# Patient Record
Sex: Male | Born: 1935 | Race: White | Hispanic: No | Smoking: Former smoker
Health system: Southern US, Community
[De-identification: ages and names within clinical notes are randomized; demographics above are authoritative.]

## PROBLEM LIST (undated history)

## (undated) DIAGNOSIS — M545 Low back pain, unspecified: Secondary | ICD-10-CM

## (undated) DIAGNOSIS — Z8679 Personal history of other diseases of the circulatory system: Secondary | ICD-10-CM

## (undated) DIAGNOSIS — R234 Changes in skin texture: Secondary | ICD-10-CM

## (undated) DIAGNOSIS — M1990 Unspecified osteoarthritis, unspecified site: Secondary | ICD-10-CM

## (undated) DIAGNOSIS — R339 Retention of urine, unspecified: Secondary | ICD-10-CM

## (undated) DIAGNOSIS — M810 Age-related osteoporosis without current pathological fracture: Secondary | ICD-10-CM

## (undated) DIAGNOSIS — H9190 Unspecified hearing loss, unspecified ear: Secondary | ICD-10-CM

## (undated) DIAGNOSIS — I499 Cardiac arrhythmia, unspecified: Secondary | ICD-10-CM

## (undated) DIAGNOSIS — I498 Other specified cardiac arrhythmias: Secondary | ICD-10-CM

## (undated) DIAGNOSIS — N529 Male erectile dysfunction, unspecified: Secondary | ICD-10-CM

## (undated) DIAGNOSIS — I219 Acute myocardial infarction, unspecified: Secondary | ICD-10-CM

## (undated) DIAGNOSIS — K219 Gastro-esophageal reflux disease without esophagitis: Secondary | ICD-10-CM

## (undated) DIAGNOSIS — I10 Essential (primary) hypertension: Secondary | ICD-10-CM

## (undated) DIAGNOSIS — M25559 Pain in unspecified hip: Secondary | ICD-10-CM

## (undated) HISTORY — DX: Age-related osteoporosis without current pathological fracture: M81.0

## (undated) HISTORY — DX: Male erectile dysfunction, unspecified: N52.9

## (undated) HISTORY — DX: Low back pain: M54.5

## (undated) HISTORY — DX: Low back pain, unspecified: M54.50

## (undated) HISTORY — DX: Unspecified osteoarthritis, unspecified site: M19.90

## (undated) HISTORY — DX: Pain in unspecified hip: M25.559

## (undated) HISTORY — PX: EYE SURGERY: SHX253

---

## 2001-04-05 ENCOUNTER — Encounter: Admission: RE | Admit: 2001-04-05 | Discharge: 2001-04-05 | Payer: Self-pay | Admitting: Family Medicine

## 2001-04-05 ENCOUNTER — Encounter: Payer: Self-pay | Admitting: Family Medicine

## 2004-04-11 ENCOUNTER — Encounter: Admission: RE | Admit: 2004-04-11 | Discharge: 2004-04-11 | Payer: Self-pay | Admitting: Family Medicine

## 2005-07-27 ENCOUNTER — Encounter: Admission: RE | Admit: 2005-07-27 | Discharge: 2005-07-27 | Payer: Self-pay | Admitting: Family Medicine

## 2008-06-14 ENCOUNTER — Ambulatory Visit: Payer: Self-pay | Admitting: Internal Medicine

## 2008-06-28 ENCOUNTER — Encounter: Payer: Self-pay | Admitting: Internal Medicine

## 2008-06-28 ENCOUNTER — Ambulatory Visit: Payer: Self-pay | Admitting: Internal Medicine

## 2008-06-28 HISTORY — PX: OTHER SURGICAL HISTORY: SHX169

## 2008-07-03 ENCOUNTER — Encounter: Payer: Self-pay | Admitting: Internal Medicine

## 2011-05-13 ENCOUNTER — Other Ambulatory Visit: Payer: Self-pay | Admitting: Dermatology

## 2011-06-24 ENCOUNTER — Other Ambulatory Visit: Payer: Self-pay | Admitting: Dermatology

## 2014-02-12 ENCOUNTER — Other Ambulatory Visit: Payer: Self-pay | Admitting: Internal Medicine

## 2014-02-12 DIAGNOSIS — R110 Nausea: Secondary | ICD-10-CM

## 2014-02-12 DIAGNOSIS — R109 Unspecified abdominal pain: Secondary | ICD-10-CM

## 2014-02-15 ENCOUNTER — Ambulatory Visit
Admission: RE | Admit: 2014-02-15 | Discharge: 2014-02-15 | Disposition: A | Discharge status: Home / Self Care | Payer: Medicare Other | Source: Ambulatory Visit | Attending: Internal Medicine | Admitting: Internal Medicine

## 2014-02-15 DIAGNOSIS — R110 Nausea: Secondary | ICD-10-CM

## 2014-02-15 DIAGNOSIS — R109 Unspecified abdominal pain: Secondary | ICD-10-CM

## 2014-03-01 ENCOUNTER — Encounter (INDEPENDENT_AMBULATORY_CARE_PROVIDER_SITE_OTHER): Payer: Self-pay | Admitting: Pediatrics

## 2014-03-06 ENCOUNTER — Encounter (INDEPENDENT_AMBULATORY_CARE_PROVIDER_SITE_OTHER): Payer: Self-pay | Admitting: Surgery

## 2014-03-06 ENCOUNTER — Ambulatory Visit (INDEPENDENT_AMBULATORY_CARE_PROVIDER_SITE_OTHER): Payer: Medicare Other | Admitting: Surgery

## 2014-03-06 VITALS — BP 133/84 | HR 71 | Temp 98.8°F | Resp 14 | Ht 64.5 in | Wt 155.0 lb

## 2014-03-06 DIAGNOSIS — K8020 Calculus of gallbladder without cholecystitis without obstruction: Secondary | ICD-10-CM

## 2014-03-06 NOTE — Progress Notes (Signed)
Patient ID: James Calhoun, male   DOB: 1927/11/08, 80 y.o.   MRN: [id number]  Chief Complaint  Patient presents with  . Abdominal Pain    HPI James Capeslroy Keesling is a 80 y.o. male.   HPI He is referred by Dr. Eloise HarmanPaterson for evaluation of symptomatic cholelithiasis.  He has been having worsening nausea for the past month.  It started out in the morning initially but now has persisted throughout the day now.  He has vague right sided pain and chronic back pain.  There is no nausea.   Past Medical History  Diagnosis Date  . Low back pain   . Hip pain   . ED (erectile dysfunction)   . Arthritis   . Osteoporosis     History reviewed. No pertinent past surgical history.  History reviewed. No pertinent family history.  Social History History  Substance Use Topics  . Smoking status: Former Games developermoker  . Smokeless tobacco: Not on file  . Alcohol Use: No    No Known Allergies  Current Outpatient Prescriptions  Medication Sig Dispense Refill  . acetaminophen (TYLENOL) 500 MG tablet Take 500 mg by mouth every 6 (six) hours as needed.      . gabapentin (NEURONTIN) 100 MG capsule Take 100 mg by mouth 3 (three) times daily.      Marland Kitchen. losartan-hydrochlorothiazide (HYZAAR) 100-25 MG per tablet Take 1 tablet by mouth daily.      . metoprolol succinate (TOPROL-XL) 25 MG 24 hr tablet       . metoprolol tartrate (LOPRESSOR) 25 MG tablet Take 25 mg by mouth 2 (two) times daily.      . Multiple Vitamins-Minerals (PRESERVISION/LUTEIN PO) Take by mouth.      . naproxen (NAPROSYN) 500 MG tablet Take 500 mg by mouth 2 (two) times daily with a meal.      . pantoprazole (PROTONIX) 40 MG tablet Take 40 mg by mouth daily.      . predniSONE (DELTASONE) 20 MG tablet Take 20 mg by mouth daily with breakfast.      . Probiotic Product (ALIGN) 4 MG CAPS Take by mouth.      . senna-docusate (SENOKOT-S) 8.6-50 MG per tablet Take 1 tablet by mouth daily.      . traMADol (ULTRAM) 50 MG tablet Take by mouth every 6 (six) hours as  needed.      . verapamil (COVERA HS) 240 MG (CO) 24 hr tablet Take 240 mg by mouth at bedtime.       No current facility-administered medications for this visit.    Review of Systems Review of Systems  Constitutional: Negative for fever, chills and unexpected weight change.  HENT: Positive for hearing loss. Negative for congestion, sore throat, trouble swallowing and voice change.   Eyes: Negative for visual disturbance.  Respiratory: Negative for cough and wheezing.   Cardiovascular: Negative for chest pain, palpitations and leg swelling.  Gastrointestinal: Positive for nausea. Negative for vomiting, abdominal pain, diarrhea, constipation, blood in stool, abdominal distention, anal bleeding and rectal pain.  Genitourinary: Negative for hematuria and difficulty urinating.  Musculoskeletal: Positive for arthralgias, back pain, gait problem, joint swelling and neck pain.  Skin: Negative for rash and wound.  Neurological: Negative for seizures, syncope, weakness and headaches.  Hematological: Negative for adenopathy. Does not bruise/bleed easily.  Psychiatric/Behavioral: Negative for confusion.    Blood pressure 133/84, pulse 71, temperature 98.8 F (37.1 C), temperature source Temporal, resp. rate 14, height 5' 4.5" (1.638 m), weight 155 lb (70.308  kg).  Physical Exam Physical Exam  Constitutional: He is oriented to person, place, and time. He appears well-developed and well-nourished. No distress.  Elderly gentleman in NAD  HENT:  Head: Normocephalic and atraumatic.  Right Ear: External ear normal.  Left Ear: External ear normal.  Nose: Nose normal.  Eyes: Conjunctivae are normal. Pupils are equal, round, and reactive to light. No scleral icterus.  Neck: Normal range of motion. Neck supple. No tracheal deviation present.  Cardiovascular: Normal rate, regular rhythm, normal heart sounds and intact distal pulses.   No murmur heard. Pulmonary/Chest: Effort normal and breath sounds  normal. No respiratory distress. He has no wheezes. He has no rales.  Abdominal: Soft. Bowel sounds are normal. He exhibits no distension. There is no tenderness. There is no rebound.  Musculoskeletal: Normal range of motion. He exhibits no edema and no tenderness.  Lymphadenopathy:    He has no cervical adenopathy.  Neurological: He is alert and oriented to person, place, and time.  Skin: Skin is warm and dry. No rash noted. He is not diaphoretic. No erythema.  Psychiatric: His behavior is normal. Judgment normal.    Data Reviewed Ultrasound shows cholelithiasis with normal CBD.  LFT's are normal  Assessment    Symptomatic cholelithiasis     Plan    I discussed this with the patient and his wife.  They want to proceed with lap chole. I discussed the procedure in detail.  The patient was given Agricultural engineereducational material.  We discussed the risks and benefits of a laparoscopic cholecystectomy and possible cholangiogram including, but not limited to bleeding, infection, injury to surrounding structures such as the intestine or liver, bile leak, retained gallstones, need to convert to an open procedure, prolonged diarrhea, blood clots such as  DVT, common bile duct injury, anesthesia risks, and possible need for additional procedures.  They also understand that this may not resolve his symptoms at all.. We discussed the typical post-operative recovery course.        Shelly RubensteinDouglas A Mickel Schreur 03/06/2014, 4:12 PM

## 2014-03-07 ENCOUNTER — Other Ambulatory Visit (HOSPITAL_COMMUNITY): Payer: Self-pay | Admitting: Anesthesiology

## 2014-03-07 ENCOUNTER — Encounter (HOSPITAL_COMMUNITY): Payer: Self-pay | Admitting: Pharmacy Technician

## 2014-03-07 ENCOUNTER — Encounter (HOSPITAL_COMMUNITY): Payer: Self-pay

## 2014-03-07 NOTE — Patient Instructions (Addendum)
20 Maurene Capeslroy Dorin  03/07/2014   Your procedure is scheduled on: Wednesday May 27th, 2015  [facility] and follow signs to  Short Stay Center at  630 AM.  Call this number if you have problems the morning of surgery [phone]   Remember:  Do not eat food or drink liquids :After Midnight.     Take these medicines the morning of surgery with A SIP OF WATER: gabapentin (neurontin), metorprolol succinate (toprol), pantaprazole (protonix)                               You may not have any metal on your body including hair pins and piercings  Do not wear jewelry, make-up, lotions, powders, or deodorant.   Men may shave face and neck.  Do not bring valuables to the hospital. [facility] IS NOT RESPONSIBLE FOR VALUABLES.  Contacts, dentures or bridgework may not be worn into surgery.  Leave suitcase in the car. After surgery it may be brought to your room.  For patients admitted to the hospital, checkout time is 11:00 AM the day of discharge.   Patients discharged the day of surgery will not be allowed to drive home.  Name and phone number of your driver:  Special Instructions: N/A  ________________________________________________________________________  [facility] - Preparing for Surgery Before surgery, you can play an important role.  Because skin is not sterile, your skin needs to be as free of germs as possible.  You can reduce the number of germs on your skin by washing with CHG (chlorahexidine gluconate) soap before surgery.  CHG is an antiseptic cleaner which kills germs and bonds with the skin to continue killing germs even after washing. Please DO NOT use if you have an allergy to CHG or antibacterial soaps.  If your skin becomes reddened/irritated stop using the CHG and inform your nurse when you arrive at Short Stay. Do not shave (including legs and underarms) for at least 48 hours prior to the first CHG shower.  You may shave your face/neck. Please  follow these instructions carefully:  1.  Shower with CHG Soap the night before surgery and the  morning of Surgery.  2.  If you choose to wash your hair, wash your hair first as usual with your  normal  shampoo.  3.  After you shampoo, rinse your hair and body thoroughly to remove the  shampoo.                           4.  Use CHG as you would any other liquid soap.  You can apply chg directly  to the skin and wash                       Gently with a scrungie or clean washcloth.  5.  Apply the CHG Soap to your body ONLY FROM THE NECK DOWN.   Do not use on face/ open                           Wound or open sores. Avoid contact with eyes, ears mouth and genitals (private parts).                       Wash face,  Genitals (private parts) with your normal soap.  6.  Wash thoroughly, paying special attention to the area where your surgery  will be performed.  7.  Thoroughly rinse your body with warm water from the neck down.  8.  DO NOT shower/wash with your normal soap after using and rinsing off  the CHG Soap.                9.  Pat yourself dry with a clean towel.            10.  Wear clean pajamas.            11.  Place clean sheets on your bed the night of your first shower and do not  sleep with pets. Day of Surgery : Do not apply any lotions/deodorants the morning of surgery.  Please wear clean clothes to the hospital/surgery center.  FAILURE TO FOLLOW THESE INSTRUCTIONS MAY RESULT IN THE CANCELLATION OF YOUR SURGERY PATIENT SIGNATURE_________________________________  NURSE SIGNATURE__________________________________  ________________________________________________________________________

## 2014-03-08 ENCOUNTER — Encounter (HOSPITAL_COMMUNITY): Payer: Self-pay

## 2014-03-08 ENCOUNTER — Encounter (INDEPENDENT_AMBULATORY_CARE_PROVIDER_SITE_OTHER): Payer: Self-pay | Admitting: General Surgery

## 2014-03-08 ENCOUNTER — Encounter (HOSPITAL_COMMUNITY)
Admission: RE | Admit: 2014-03-08 | Discharge: 2014-03-08 | Disposition: A | Discharge status: Home / Self Care | Payer: Medicare Other | Source: Ambulatory Visit | Attending: Surgery | Admitting: Surgery

## 2014-03-08 ENCOUNTER — Telehealth (INDEPENDENT_AMBULATORY_CARE_PROVIDER_SITE_OTHER): Payer: Self-pay

## 2014-03-08 ENCOUNTER — Ambulatory Visit (HOSPITAL_COMMUNITY)
Admission: RE | Admit: 2014-03-08 | Discharge: 2014-03-08 | Disposition: A | Discharge status: Home / Self Care | Payer: Medicare Other | Source: Ambulatory Visit | Attending: Anesthesiology | Admitting: Anesthesiology

## 2014-03-08 ENCOUNTER — Other Ambulatory Visit (INDEPENDENT_AMBULATORY_CARE_PROVIDER_SITE_OTHER): Payer: Self-pay | Admitting: Surgery

## 2014-03-08 DIAGNOSIS — J9819 Other pulmonary collapse: Secondary | ICD-10-CM | POA: Insufficient documentation

## 2014-03-08 DIAGNOSIS — K829 Disease of gallbladder, unspecified: Secondary | ICD-10-CM

## 2014-03-08 DIAGNOSIS — Z01812 Encounter for preprocedural laboratory examination: Secondary | ICD-10-CM | POA: Insufficient documentation

## 2014-03-08 DIAGNOSIS — M47814 Spondylosis without myelopathy or radiculopathy, thoracic region: Secondary | ICD-10-CM | POA: Insufficient documentation

## 2014-03-08 DIAGNOSIS — I517 Cardiomegaly: Secondary | ICD-10-CM | POA: Insufficient documentation

## 2014-03-08 DIAGNOSIS — I10 Essential (primary) hypertension: Secondary | ICD-10-CM | POA: Insufficient documentation

## 2014-03-08 DIAGNOSIS — Z01818 Encounter for other preprocedural examination: Secondary | ICD-10-CM | POA: Insufficient documentation

## 2014-03-08 DIAGNOSIS — Z01810 Encounter for preprocedural cardiovascular examination: Secondary | ICD-10-CM | POA: Insufficient documentation

## 2014-03-08 HISTORY — DX: Gastro-esophageal reflux disease without esophagitis: K21.9

## 2014-03-08 HISTORY — DX: Essential (primary) hypertension: I10

## 2014-03-08 HISTORY — DX: Changes in skin texture: R23.4

## 2014-03-08 LAB — BASIC METABOLIC PANEL
BUN: 20 mg/dL (ref 6–23)
CALCIUM: 9.8 mg/dL (ref 8.4–10.5)
CO2: 25 meq/L (ref 19–32)
CREATININE: 1.15 mg/dL (ref 0.50–1.35)
Chloride: 107 mEq/L (ref 96–112)
GFR calc Af Amer: 66 mL/min — ABNORMAL LOW (ref >90)
GFR, EST NON AFRICAN AMERICAN: 57 mL/min — AB (ref >90)
GLUCOSE: 100 mg/dL — AB (ref 70–99)
Potassium: 6.2 mEq/L — ABNORMAL HIGH (ref 3.7–5.3)
Sodium: 144 mEq/L (ref 137–147)

## 2014-03-08 LAB — CBC
HCT: 42.3 % (ref 39.0–52.0)
Hemoglobin: 14.4 g/dL (ref 13.0–17.0)
MCH: 32.4 pg (ref 26.0–34.0)
MCHC: 34 g/dL (ref 30.0–36.0)
MCV: 95.3 fL (ref 78.0–100.0)
PLATELETS: 172 10*3/uL (ref 150–400)
RBC: 4.44 MIL/uL (ref 4.22–5.81)
RDW: 13.3 % (ref 11.5–15.5)
WBC: 6.4 10*3/uL (ref 4.0–10.5)

## 2014-03-08 NOTE — Telephone Encounter (Signed)
Pt is scheduled for lap chole on 03/14/14 by Dr. Magnus IvanBlackman.  Pre op EKG shows bigeminy and anesthesia requires cardiac clearance.

## 2014-03-08 NOTE — Progress Notes (Signed)
bmet results routed to dr Riley Lamdouglas blackman inbasket by epic

## 2014-03-08 NOTE — Progress Notes (Signed)
Spoke with dr rose and made aware ekg results, pt needs cardiac clearance per dr rose.

## 2014-03-08 NOTE — Progress Notes (Signed)
Spoke with James Calhoun to make dr Riley Lamdouglas blackman aware had abnormal ekg at pre op 03-08-14 and needs cardiac clearance per dr rose.

## 2014-03-08 NOTE — Telephone Encounter (Signed)
Called patient wife and told her that we need to get cardiac clearance per anesthesia requires, she wanted me to call [facility] to [facility] and they had their first apt was 04-16-14, so I called [facility] and they did not have anything until 03-20-14, but per Gavin Poundeborah that Dr Magnus IvanBlackman can call tomorrow and talk to the DOD of the day to see if he can come in tomorrow and that will be Dr. Shirlee LatchMcLean. I told Mrs. Darcella GasmanDodson that we will call her tomorrow to let her know what we find out, and if we will get him an apt tomorrow or his maybe canceled. Patient wife understood

## 2014-03-09 ENCOUNTER — Encounter (INDEPENDENT_AMBULATORY_CARE_PROVIDER_SITE_OTHER): Payer: Self-pay | Admitting: General Surgery

## 2014-03-09 ENCOUNTER — Telehealth (INDEPENDENT_AMBULATORY_CARE_PROVIDER_SITE_OTHER): Payer: Self-pay | Admitting: General Surgery

## 2014-03-09 NOTE — Telephone Encounter (Signed)
Called patient wife to let her know that we have canceled his surgery on 03-14-14. The is schedule for to see Dr Armanda Magic on 03-19-14 @ 10:30 for 10:45. The wife understood

## 2014-03-14 ENCOUNTER — Ambulatory Visit (HOSPITAL_COMMUNITY): Admission: RE | Admit: 2014-03-14 | Payer: Medicare Other | Source: Ambulatory Visit | Admitting: Surgery

## 2014-03-14 ENCOUNTER — Encounter (HOSPITAL_COMMUNITY): Admission: RE | Payer: Self-pay | Source: Ambulatory Visit

## 2014-03-14 SURGERY — LAPAROSCOPIC CHOLECYSTECTOMY
Anesthesia: General

## 2014-03-19 ENCOUNTER — Encounter: Payer: Self-pay | Admitting: Internal Medicine

## 2014-03-19 ENCOUNTER — Telehealth (INDEPENDENT_AMBULATORY_CARE_PROVIDER_SITE_OTHER): Payer: Self-pay

## 2014-03-19 ENCOUNTER — Ambulatory Visit (INDEPENDENT_AMBULATORY_CARE_PROVIDER_SITE_OTHER): Payer: Medicare Other | Admitting: Internal Medicine

## 2014-03-19 ENCOUNTER — Institutional Professional Consult (permissible substitution): Payer: Medicare Other | Admitting: Cardiology

## 2014-03-19 VITALS — BP 120/62 | HR 57 | Ht 66.0 in | Wt 160.1 lb

## 2014-03-19 DIAGNOSIS — I493 Ventricular premature depolarization: Secondary | ICD-10-CM

## 2014-03-19 DIAGNOSIS — I4949 Other premature depolarization: Secondary | ICD-10-CM

## 2014-03-19 DIAGNOSIS — I499 Cardiac arrhythmia, unspecified: Secondary | ICD-10-CM

## 2014-03-19 DIAGNOSIS — I10 Essential (primary) hypertension: Secondary | ICD-10-CM

## 2014-03-19 DIAGNOSIS — R0602 Shortness of breath: Secondary | ICD-10-CM

## 2014-03-19 DIAGNOSIS — Z01810 Encounter for preprocedural cardiovascular examination: Secondary | ICD-10-CM

## 2014-03-19 DIAGNOSIS — I498 Other specified cardiac arrhythmias: Secondary | ICD-10-CM

## 2014-03-19 NOTE — Telephone Encounter (Signed)
Jenna from Dr [facility] office saw pt today for cardiac clearance for his surgery. Dr Rennis Golden has ordered a stress test for this pt on 03/30/14 before he will give his cardiac clearance. Informed Eileen Stanford that I would let Dr Magnus Ivan and Pattricia Boss know of this.

## 2014-03-19 NOTE — Patient Instructions (Addendum)
Happy [birth year] Birthday!  Your physician has requested that you have a lexiscan myoview. For further information please visit [email or web address] Please follow instruction sheet, as given.  Your physician recommends that you schedule a follow-up appointment in: 1 month.

## 2014-03-21 ENCOUNTER — Other Ambulatory Visit (INDEPENDENT_AMBULATORY_CARE_PROVIDER_SITE_OTHER): Payer: Self-pay | Admitting: Surgery

## 2014-03-21 ENCOUNTER — Encounter: Payer: Self-pay | Admitting: Internal Medicine

## 2014-03-21 DIAGNOSIS — I499 Cardiac arrhythmia, unspecified: Secondary | ICD-10-CM

## 2014-03-21 DIAGNOSIS — Z01810 Encounter for preprocedural cardiovascular examination: Secondary | ICD-10-CM | POA: Insufficient documentation

## 2014-03-21 DIAGNOSIS — I10 Essential (primary) hypertension: Secondary | ICD-10-CM | POA: Insufficient documentation

## 2014-03-21 DIAGNOSIS — I498 Other specified cardiac arrhythmias: Secondary | ICD-10-CM | POA: Insufficient documentation

## 2014-03-21 NOTE — Progress Notes (Signed)
OFFICE NOTE  Chief Complaint:  Pre-operative cardiac exam  Primary Care Physician: Garlan Fillers, MD  HPI:  James Calhoun is an 80 year old male has been having some recent abdominal pain and was diagnosed with cholelithiasis. He was scheduled to have elective cholecystectomy and a preoperative EKG demonstrated bigeminal PVCs. He is unaware of these PVCs. In fact he does not report any chest pain or shortness of breath with exertion. He denies any new fatigue or change in his exercise tolerance. His past medical history significant for hypertension, but no known coronary artery disease, no diabetes and no evidence for dyslipidemia.  An EKG in the office today again shows bigeminal PVCs with a heart rate of 57 and a possible left atrial enlargement.  PMHx:  Past Medical History  Diagnosis Date  . Low back pain   . Hip pain   . ED (erectile dysfunction)   . Arthritis   . Osteoporosis   . Hypertension   . GERD (gastroesophageal reflux disease)   . Thin skin     bruises easy    Past Surgical History  Procedure Laterality Date  . Colonscopy  06-28-2008  . Eye surgery Bilateral yrs ago    cataract lens replacments    FAMHx:  Family History  Problem Relation Age of Onset  . Cancer Mother   . Heart disease Father   . Heart disease Brother     SOCHx:   reports that he quit smoking about 45 years ago. His smoking use included Cigarettes. He has a 7.5 pack-year smoking history. He has never used smokeless tobacco. He reports that he does not drink alcohol or use illicit drugs.  ALLERGIES:  No Known Allergies  ROS: A comprehensive review of systems was negative except for: Gastrointestinal: positive for abdominal pain  HOME MEDS: Current Outpatient Prescriptions  Medication Sig Dispense Refill  . acetaminophen (TYLENOL) 500 MG tablet Take 500 mg by mouth every 6 (six) hours as needed for mild pain.       Marland Kitchen losartan-hydrochlorothiazide (HYZAAR) 100-25 MG per tablet  Take 1 tablet by mouth every morning.       . metoprolol succinate (TOPROL-XL) 25 MG 24 hr tablet Take 25 mg by mouth every morning.       . Multiple Vitamins-Minerals (PRESERVISION/LUTEIN PO) Take 1 tablet by mouth daily.       . naproxen (NAPROSYN) 500 MG tablet Take 500 mg by mouth 2 (two) times daily with a meal.      . Probiotic Product (ALIGN) 4 MG CAPS Take 4 mg by mouth daily.       Marland Kitchen senna-docusate (SENOKOT-S) 8.6-50 MG per tablet Take 1 tablet by mouth daily.      . traMADol (ULTRAM) 50 MG tablet Take 50 mg by mouth every 6 (six) hours as needed for moderate pain.       . verapamil (COVERA HS) 240 MG (CO) 24 hr tablet Take 240 mg by mouth at bedtime.       No current facility-administered medications for this visit.    LABS/IMAGING: No results found for this or any previous visit (from the past 48 hour(s)). No results found.  VITALS: BP 120/62  Pulse 57  Ht 5\' 6"  (1.676 m)  Wt 160 lb 1.6 oz (72.621 kg)  BMI 25.85 kg/m2  EXAM: General appearance: alert and no distress Neck: no carotid bruit, no JVD and thyroid not enlarged, symmetric, no tenderness/mass/nodules Lungs: clear to auscultation bilaterally Heart: regular rate and rhythm, S1,  S2 normal, no murmur, click, rub or gallop Abdomen: soft, non-tender; bowel sounds normal; no masses,  no organomegaly Extremities: extremities normal, atraumatic, no cyanosis or edema Pulses: 2+ and symmetric Skin: Skin color, texture, turgor normal. No rashes or lesions Neurologic: Grossly normal PSych: Mood, affect normal  EKG: Sinus bradycardia at 57 with bigeminal PVCs  ASSESSMENT: 1. Indeterminate perioperative risk 2. New onset bigeminal PVCs  PLAN: 1.   Mr. James Calhoun has new onset bigeminal PVCs for which she is asymptomatic. He denies any chest pain or worsening shortness of breath. I would recommend an ischemia evaluation preoperatively and will order a lexiscan nuclear stress test. If this is negative, we may be able to  increase his beta blocker slightly to see if this impacts his PVCs.  Will plan followup after the results of the stress test are available.  Thank you for the kind referral.  Chrystie NoseKenneth C. Armando Lauman, MD, [facility] Attending Cardiologist [facility]  Chrystie NoseKenneth C. Shaquoia Miers 03/21/2014, 10:04 AM

## 2014-03-28 ENCOUNTER — Telehealth (HOSPITAL_COMMUNITY): Payer: Self-pay

## 2014-03-30 ENCOUNTER — Ambulatory Visit (HOSPITAL_COMMUNITY)
Admission: RE | Admit: 2014-03-30 | Discharge: 2014-03-30 | Disposition: A | Discharge status: Home / Self Care | Payer: Medicare Other | Source: Ambulatory Visit | Attending: Cardiology | Admitting: Cardiology

## 2014-03-30 ENCOUNTER — Encounter (INDEPENDENT_AMBULATORY_CARE_PROVIDER_SITE_OTHER): Payer: Medicare Other | Admitting: Surgery

## 2014-03-30 DIAGNOSIS — R9431 Abnormal electrocardiogram [ECG] [EKG]: Secondary | ICD-10-CM | POA: Insufficient documentation

## 2014-03-30 DIAGNOSIS — I4949 Other premature depolarization: Secondary | ICD-10-CM

## 2014-03-30 DIAGNOSIS — Z01810 Encounter for preprocedural cardiovascular examination: Secondary | ICD-10-CM

## 2014-03-30 DIAGNOSIS — I10 Essential (primary) hypertension: Secondary | ICD-10-CM | POA: Insufficient documentation

## 2014-03-30 DIAGNOSIS — R0602 Shortness of breath: Secondary | ICD-10-CM | POA: Insufficient documentation

## 2014-03-30 DIAGNOSIS — Z87891 Personal history of nicotine dependence: Secondary | ICD-10-CM | POA: Insufficient documentation

## 2014-03-30 DIAGNOSIS — I493 Ventricular premature depolarization: Secondary | ICD-10-CM

## 2014-03-30 MED ORDER — REGADENOSON 0.4 MG/5ML IV SOLN
0.4000 mg | Freq: Once | INTRAVENOUS | Status: AC
  Administered 2014-03-30: 0.4 mg via INTRAVENOUS

## 2014-03-30 MED ORDER — TECHNETIUM TC 99M SESTAMIBI GENERIC - CARDIOLITE
30.5000 | Freq: Once | INTRAVENOUS | Status: AC | PRN
  Administered 2014-03-30: 31 via INTRAVENOUS

## 2014-03-30 MED ORDER — TECHNETIUM TC 99M SESTAMIBI GENERIC - CARDIOLITE
10.4000 | Freq: Once | INTRAVENOUS | Status: AC | PRN
  Administered 2014-03-30: 10 via INTRAVENOUS

## 2014-03-30 MED ORDER — AMINOPHYLLINE 25 MG/ML IV SOLN
125.0000 mg | Freq: Once | INTRAVENOUS | Status: AC
  Administered 2014-03-30: 125 mg via INTRAVENOUS

## 2014-03-30 NOTE — Procedures (Addendum)
[facility] [address] [place] [address] [phone]  Cardiology Nuclear Med Study  James Calhoun is a 80 y.o. male     MRN : [id number]     DOB: 08-31-1928  Procedure Date: 03/30/2014  Nuclear Med Background Indication for Stress Test:  Surgical Clearance and Abnormal EKG History:  No prior respiratory or cardiac history reported;No prior NUC MPI for comparison. Cardiac Risk Factors: Family History - CAD, History of Smoking and Hypertension  Symptoms:  SOB   Nuclear Pre-Procedure Caffeine/Decaff Intake:  7:00pm NPO After: 5:00am   IV Site: R Forearm  IV 0.9% NS with Angio Cath:  22g  Chest Size (in):  44"  IV Started by: Berdie OgrenAmanda Wease, RN  Height: 5\' 6"  (1.676 m)  Cup Size: n/a  BMI:  Body mass index is 25.84 kg/(m^2). Weight:  160 lb (72.576 kg)   Tech Comments:  n/a    Nuclear Med Study 1 or 2 day study: 1 day  Stress Test Type:  Lexiscan  Order Authorizing Provider:  Zoila ShutterKenneth Hilty, MD   Resting Radionuclide: Technetium 8996m Sestamibi  Resting Radionuclide Dose: 10.4 mCi   Stress Radionuclide:  Technetium 1596m Sestamibi  Stress Radionuclide Dose: 30.5 mCi           Stress Protocol Rest HR: 61 Stress HR:87  Rest BP: 170/99 Stress BP: 170/99  Exercise Time (min): n/a METS: n/a          Dose of Adenosine (mg):  n/a Dose of Lexiscan: 0.4 mg  Dose of Atropine (mg): n/a Dose of Dobutamine: n/a mcg/kg/min (at max HR)  Stress Test Technologist: Ernestene MentionGwen Farrington, CCT Nuclear Technologist: Gonzella LexPam Phillips, CNMT   Rest Procedure:  Myocardial perfusion imaging was performed at rest 45 minutes following the intravenous administration of Technetium 3396m Sestamibi. Stress Procedure:  The patient received IV Lexiscan 0.4 mg over 15-seconds.  Technetium 4096m Sestamibi injected Iv at 30-seconds.  Patient experienced shortness of breath and was administered 125 mg of Aminophylline IV at 5 minutes. There were no significant  changes with Lexiscan.  Quantitative spect images were obtained after a 45 minute delay.  Transient Ischemic Dilatation (Normal <1.22):  1.05   QGS EDV:  n/a ml QGS ESV:  n/a ml LV Ejection Fraction: Study not gated       Rest ECG: NSR with frequent PVCs, old anteroseptal infarction  Stress ECG: No significant change from baseline ECG  QPS Raw Data Images:  Normal; no motion artifact; normal heart/lung ratio. Stress Images:  severe apical perfusion defect, extending in to the mid-apical anterior septum, sparing the anterolateral wall. Moderate to severe inferior defect Rest Images:  Comparison with the stress images reveals no significant change. Subtraction (SDS):  There is a fixed defect that is most consistent with a previous infarction. LV Wall Motion:  non gated  Impression Exercise Capacity:  Lexiscan with no exercise. BP Response:  Normal blood pressure response. Clinical Symptoms:  No significant symptoms noted. ECG Impression:  No significant ECG changes with Lexiscan. Comparison with Prior Nuclear Study: No previous nuclear study performed   Overall Impression:  Intermediate risk stress nuclear study with a large inferior scar and a large apical scar. There is no significant reversible ischemia. The study was not gated due to ectopy.James Calhoun.   James Gathright, MD  03/30/2014 1:25 PM

## 2014-04-02 ENCOUNTER — Other Ambulatory Visit: Payer: Self-pay | Admitting: *Deleted

## 2014-04-02 ENCOUNTER — Telehealth (INDEPENDENT_AMBULATORY_CARE_PROVIDER_SITE_OTHER): Payer: Self-pay

## 2014-04-02 ENCOUNTER — Telehealth: Payer: Self-pay | Admitting: Internal Medicine

## 2014-04-02 ENCOUNTER — Telehealth: Payer: Self-pay | Admitting: *Deleted

## 2014-04-02 ENCOUNTER — Encounter: Payer: Self-pay | Admitting: *Deleted

## 2014-04-02 DIAGNOSIS — I493 Ventricular premature depolarization: Secondary | ICD-10-CM

## 2014-04-02 DIAGNOSIS — Z01818 Encounter for other preprocedural examination: Secondary | ICD-10-CM

## 2014-04-02 DIAGNOSIS — I499 Cardiac arrhythmia, unspecified: Principal | ICD-10-CM

## 2014-04-02 DIAGNOSIS — R9439 Abnormal result of other cardiovascular function study: Secondary | ICD-10-CM

## 2014-04-02 DIAGNOSIS — I498 Other specified cardiac arrhythmias: Secondary | ICD-10-CM

## 2014-04-02 DIAGNOSIS — Z01810 Encounter for preprocedural cardiovascular examination: Secondary | ICD-10-CM

## 2014-04-02 NOTE — Telephone Encounter (Signed)
Patient's wife notified of stress test results and need for echo. Echo ordered. They would prefer AM appmts.   Dr. Rennis GoldenHilty wants to see patient for follow up before clearance for surgery   Message sent to schedulers to set up appmts.

## 2014-04-02 NOTE — Telephone Encounter (Signed)
[facility] scheduler informed that no echo appmts are available until after patient's OV with Dr. Rennis GoldenHilty on 6/22  Will re-order echo for [facility] office and move patient appointment

## 2014-04-02 NOTE — Telephone Encounter (Signed)
Message copied by Lindell SparELKINS, Janari Gagner M on Mon Apr 02, 2014  8:08 AM ------      Message from: Chrystie NoseHILTY, KENNETH C      Created: Sun Apr 01, 2014 10:25 AM      Regarding: FW:       Nuclear stress test was abnormal. Suggests prior heart attack. Would recommend 2d Echo to look for wall motion and EF prior to coming back to see me. May ultimately need a cath.            Dr. Rennis GoldenHilty      ----- Message -----         From: Thurmon FairMihai Croitoru, MD         Sent: 03/30/2014   1:29 PM           To: Chrystie NoseKenneth C. Hilty, MD       ------

## 2014-04-02 NOTE — Telephone Encounter (Signed)
Message copied by Lindell SparELKINS, Haru Anspaugh M on Mon Apr 02, 2014  2:34 PM ------      Message from: Cleon GustinBROWN-HOLLIMAN, EBONY D      Created: Mon Apr 02, 2014  2:26 PM      Regarding: RE: needs echo       I dont schedule for [address], however if you change the order and set the location for the test for [address] it will go on their work queue.      ----- Message -----         From: Lindell SparJenna M Josiah Wojtaszek, RN         Sent: 04/02/2014   2:21 PM           To: Cleon GustinEbony Brown-Holliman      Subject: RE: needs echo                                           Can you schedule at [address]? He has an office visit with Dr. Rennis GoldenHilty on 6/22 and it needs to be done before then.. May need possible cath that's why.. And its a semi-urgent pre-op issue.Marland Kitchen.       ----- Message -----         From: Cleon GustinEbony Brown-Holliman         Sent: 04/02/2014   2:20 PM           To: Lindell SparJenna M Kinzey Sheriff, RN      Subject: RE: needs echo                                           I dont have any echo appts this week at all. The first avail is not until next Tuesday/Wednesday      ----- Message -----         From: Lindell SparJenna M Rhapsody Wolven, RN         Sent: 04/02/2014   8:11 AM           To: Cleon GustinEbony Brown-Holliman      Subject: needs echo                                               Patient needs echo this week! Thanks a ton! They prefer AM appmts                   ------

## 2014-04-02 NOTE — Telephone Encounter (Signed)
Pt is awaiting cardiac clearance from Dr Rennis GoldenHilty. Pt has had a stress test and he will have a echo this week. Pt has appt with Dr Rennis GoldenHilty on 04/09/14 before he will clear him for surgery. Informed Eileen StanfordJenna that I would make  Pattricia Bossnnie and Dr Magnus IvanBlackman aware.

## 2014-04-05 ENCOUNTER — Encounter (INDEPENDENT_AMBULATORY_CARE_PROVIDER_SITE_OTHER): Payer: Self-pay

## 2014-04-09 ENCOUNTER — Ambulatory Visit: Payer: Medicare Other | Admitting: Internal Medicine

## 2014-04-11 ENCOUNTER — Ambulatory Visit (HOSPITAL_COMMUNITY)
Admission: RE | Admit: 2014-04-11 | Discharge: 2014-04-11 | Disposition: A | Discharge status: Home / Self Care | Payer: Medicare Other | Source: Ambulatory Visit | Attending: Cardiology | Admitting: Cardiology

## 2014-04-11 DIAGNOSIS — I493 Ventricular premature depolarization: Secondary | ICD-10-CM

## 2014-04-11 DIAGNOSIS — I498 Other specified cardiac arrhythmias: Secondary | ICD-10-CM

## 2014-04-11 DIAGNOSIS — Z01818 Encounter for other preprocedural examination: Secondary | ICD-10-CM

## 2014-04-11 DIAGNOSIS — I517 Cardiomegaly: Secondary | ICD-10-CM

## 2014-04-11 DIAGNOSIS — Z01810 Encounter for preprocedural cardiovascular examination: Secondary | ICD-10-CM

## 2014-04-11 DIAGNOSIS — I499 Cardiac arrhythmia, unspecified: Secondary | ICD-10-CM

## 2014-04-11 DIAGNOSIS — R9439 Abnormal result of other cardiovascular function study: Secondary | ICD-10-CM | POA: Insufficient documentation

## 2014-04-11 MED ORDER — PERFLUTREN LIPID MICROSPHERE
1.0000 mL | INTRAVENOUS | Status: AC | PRN
  Administered 2014-04-11: 3 mL via INTRAVENOUS

## 2014-04-11 NOTE — Brief Op Note (Signed)
Pt had echo with definity per protocol. Pt tolerated well. Pt being observed for any adverse reactions prior to discharge. Pt B/P prior to echo= 126/62; Pt BP after echo 132/65; HR=72 bpm. Pt sitting up in chair at bedside. Pt talking with staff no distress noted.

## 2014-04-11 NOTE — Progress Notes (Addendum)
2D Echocardiogram Complete.  04/11/2014   Bethany McMahill, RDCS   Definity Contrast was administered to opacify the Left Ventricular Apex.

## 2014-04-12 ENCOUNTER — Ambulatory Visit (INDEPENDENT_AMBULATORY_CARE_PROVIDER_SITE_OTHER): Payer: Medicare Other | Admitting: Internal Medicine

## 2014-04-12 ENCOUNTER — Encounter: Payer: Self-pay | Admitting: Internal Medicine

## 2014-04-12 VITALS — BP 120/58 | HR 50 | Ht 66.0 in | Wt 157.3 lb

## 2014-04-12 DIAGNOSIS — I252 Old myocardial infarction: Secondary | ICD-10-CM

## 2014-04-12 DIAGNOSIS — I499 Cardiac arrhythmia, unspecified: Principal | ICD-10-CM

## 2014-04-12 DIAGNOSIS — I2589 Other forms of chronic ischemic heart disease: Secondary | ICD-10-CM

## 2014-04-12 DIAGNOSIS — Z01810 Encounter for preprocedural cardiovascular examination: Secondary | ICD-10-CM

## 2014-04-12 DIAGNOSIS — I255 Ischemic cardiomyopathy: Secondary | ICD-10-CM | POA: Insufficient documentation

## 2014-04-12 DIAGNOSIS — I498 Other specified cardiac arrhythmias: Secondary | ICD-10-CM

## 2014-04-12 DIAGNOSIS — I10 Essential (primary) hypertension: Secondary | ICD-10-CM

## 2014-04-12 NOTE — Progress Notes (Signed)
OFFICE NOTE  Chief Complaint:  Pre-operative cardiac exam  Primary Care Physician: Garlan FillersPATERSON,DANIEL G, MD  HPI:  Maurene Capeslroy Delarosa is an 80 year old male has been having some recent abdominal pain and was diagnosed with cholelithiasis. He was scheduled to have elective cholecystectomy and a preoperative EKG demonstrated bigeminal PVCs. He is unaware of these PVCs. In fact he does not report any chest pain or shortness of breath with exertion. He denies any new fatigue or change in his exercise tolerance. His past medical history significant for hypertension, but no known coronary artery disease, no diabetes and no evidence for dyslipidemia.  An EKG in the office today again shows bigeminal PVCs with a heart rate of 57 and a possible left atrial enlargement.  Mr. Laural BenesJohnson returns today for followup. Again he remains asymptomatic. He stress test indicated a dense inferoapical defect suggestive of scar. The nuclear stress test was non-gated. He subsequently underwent an echocardiogram which showed infero-apical akinesis and an EF of 45-50%. 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 He is unaware of this event. There was no evidence for ischemia. He is on good medications for heart failure including metoprolol and losartan.   PMHx:  Past Medical History  Diagnosis Date  . Low back pain   . Hip pain   . ED (erectile dysfunction)   . Arthritis   . Osteoporosis   . Hypertension   . GERD (gastroesophageal reflux disease)   . Thin skin     bruises easy    Past Surgical History  Procedure Laterality Date  . Colonscopy  06-28-2008  . Eye surgery Bilateral yrs ago    cataract lens replacments    FAMHx:  Family History  Problem Relation Age of Onset  . Cancer Mother   . Heart disease Father   . Heart disease Brother     SOCHx:   reports that he quit smoking about 45 years ago. His smoking use included Cigarettes. He has a 7.5 pack-year smoking history. He has never used smokeless tobacco.  He reports that he does not drink alcohol or use illicit drugs.  ALLERGIES:  No Known Allergies  ROS: A comprehensive review of systems was negative except for: Gastrointestinal: positive for abdominal pain  HOME MEDS: Current Outpatient Prescriptions  Medication Sig Dispense Refill  . acetaminophen (TYLENOL) 500 MG tablet Take 500 mg by mouth every 6 (six) hours as needed for mild pain.       Marland Kitchen. losartan-hydrochlorothiazide (HYZAAR) 100-25 MG per tablet Take 1 tablet by mouth every morning.       . metoprolol succinate (TOPROL-XL) 25 MG 24 hr tablet Take 25 mg by mouth every morning.       . Multiple Vitamins-Minerals (PRESERVISION/LUTEIN PO) Take 1 tablet by mouth daily.       . naproxen (NAPROSYN) 500 MG tablet Take 500 mg by mouth 2 (two) times daily with a meal.      . Probiotic Product (ALIGN) 4 MG CAPS Take 4 mg by mouth daily.       Marland Kitchen. senna-docusate (SENOKOT-S) 8.6-50 MG per tablet Take 1 tablet by mouth daily.      . traMADol (ULTRAM) 50 MG tablet Take 50 mg by mouth every 6 (six) hours as needed for moderate pain.       . verapamil (COVERA HS) 240 MG (CO) 24 hr tablet Take 240 mg by mouth at bedtime.       No current facility-administered medications for this visit.  LABS/IMAGING: No results found for this or any previous visit (from the past 48 hour(s)). No results found.  VITALS: BP 120/58  Pulse 50  Ht 5\' 6"  (1.676 m)  Wt 157 lb 4.8 oz (71.351 kg)  BMI 25.40 kg/m2  EXAM: Deferred  EKG: deferred  ASSESSMENT: 1. Low to intermediate risk for surgery 2. Ischemic cardiomyopathy, EF 45-50%, inferoapical scar 3. 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   Mr. Darcella GasmanDodson does not have any evidence of reversible ischemia on his nuclear stress test. There is however an inferior scar which is fixed and is seen on echocardiogram. His EF is 45-50%. His bigeminy may be scar mediated. He is ready on beta blocker and has had no concerning symptoms such as syncope.  Since he is asymptomatic and his nuclear stress test is nonischemic, there really is not a role for cardiac catheterization at this time. I would recommend continuing his current medications and start low-dose aspirin 81 mg daily after his gallbladder surgery. I feel that he is at low to intermediate risk for surgery.  Plan to see him back in 6 months.   Chrystie NoseKenneth C. Hilty, MD, [employer] Attending Cardiologist [facility]  HILTY,Kenneth C 04/12/2014, 1:42 PM

## 2014-04-12 NOTE — Patient Instructions (Signed)
Your physician wants you to follow-up in:  6 months. You will receive a reminder letter in the mail two months in advance. If you don't receive a letter, please call our office to schedule the follow-up appointment.   

## 2014-04-13 ENCOUNTER — Encounter (HOSPITAL_COMMUNITY): Payer: Self-pay | Admitting: Pharmacy Technician

## 2014-04-16 ENCOUNTER — Encounter (HOSPITAL_COMMUNITY): Payer: Self-pay

## 2014-04-16 ENCOUNTER — Encounter (INDEPENDENT_AMBULATORY_CARE_PROVIDER_SITE_OTHER): Payer: Self-pay

## 2014-04-16 ENCOUNTER — Other Ambulatory Visit (INDEPENDENT_AMBULATORY_CARE_PROVIDER_SITE_OTHER): Payer: Self-pay | Admitting: Surgery

## 2014-04-16 ENCOUNTER — Encounter (HOSPITAL_COMMUNITY)
Admission: RE | Admit: 2014-04-16 | Discharge: 2014-04-16 | Disposition: A | Discharge status: Home / Self Care | Payer: Medicare Other | Source: Ambulatory Visit | Attending: Surgery | Admitting: Surgery

## 2014-04-16 LAB — CBC
HCT: 40.7 % (ref 39.0–52.0)
Hemoglobin: 13.9 g/dL (ref 13.0–17.0)
MCH: 32.3 pg (ref 26.0–34.0)
MCHC: 34.2 g/dL (ref 30.0–36.0)
MCV: 94.7 fL (ref 78.0–100.0)
PLATELETS: 181 10*3/uL (ref 150–400)
RBC: 4.3 MIL/uL (ref 4.22–5.81)
RDW: 13.5 % (ref 11.5–15.5)
WBC: 8.2 10*3/uL (ref 4.0–10.5)

## 2014-04-16 LAB — BASIC METABOLIC PANEL
BUN: 23 mg/dL (ref 6–23)
CHLORIDE: 106 meq/L (ref 96–112)
CO2: 24 mEq/L (ref 19–32)
Calcium: 9.6 mg/dL (ref 8.4–10.5)
Creatinine, Ser: 1.1 mg/dL (ref 0.50–1.35)
GFR calc Af Amer: 69 mL/min — ABNORMAL LOW (ref >90)
GFR calc non Af Amer: 60 mL/min — ABNORMAL LOW (ref >90)
Glucose, Bld: 80 mg/dL (ref 70–99)
POTASSIUM: 4.8 meq/L (ref 3.7–5.3)
Sodium: 142 mEq/L (ref 137–147)

## 2014-04-16 NOTE — Patient Instructions (Addendum)
22 James Calhoun  04/16/2014   Your procedure is scheduled on: Thursday 04/19/14  [facility] 09:30 AM.  Call this number if you have problems the morning of surgery [phone]: [phone]   Remember:   Do not eat food or drink liquids After Midnight.     Take these medicines the morning of surgery with A SIP OF WATER: metoprolol   Do not wear jewelry, make-up or nail polish.  Do not wear lotions, powders, or perfumes. You may wear deodorant.  Do not shave 48 hours prior to surgery. Men may shave face and neck.  Do not bring valuables to the hospital.  Contacts, dentures or bridgework may not be worn into surgery.  Leave suitcase in the car. After surgery it may be brought to your room.  For patients admitted to the hospital, checkout time is 11:00 AM the day of discharge.  James Sonsachel Loftis, RN  pre op nurse call if needed [phone]    [facility] - Preparing [facility] Before surgery, you can play an important role.  Because skin is not sterile, your skin needs to be as free of germs as possible.  You can reduce the number of germs on your skin by washing with CHG (chlorahexidine gluconate) soap before surgery.  CHG is an antiseptic cleaner which kills germs and bonds with the skin to continue killing germs even after washing. Please DO NOT use if you have an allergy to CHG or antibacterial soaps.  If your skin becomes reddened/irritated stop using the CHG and inform your nurse when you arrive at Short Stay. Do not shave (including legs and underarms) for at least 48 hours prior to the first CHG shower.  You may shave your face/neck. Please follow these instructions carefully:  1.  Shower with CHG Soap the night before surgery and the  morning of Surgery.  2.  If you choose to wash your hair, wash your hair first as usual with your  normal  shampoo.  3.  After you shampoo, rinse your hair and body thoroughly to remove the  shampoo.                            4.   Use CHG as you would any other liquid soap.  You can apply chg directly  to the skin and wash                       Gently with a scrungie or clean washcloth.  5.  Apply the CHG Soap to your body ONLY FROM THE NECK DOWN.   Do not use on face/ open                           Wound or open sores. Avoid contact with eyes, ears mouth and genitals (private parts).                       Wash face,  Genitals (private parts) with your normal soap.             6.  Wash thoroughly, paying special attention to the area where your surgery  will be performed.  7.  Thoroughly rinse your body with warm water from the neck down.  8.  DO NOT shower/wash with your normal soap after using and rinsing off  the CHG Soap.  9.  Pat yourself dry with a clean towel.            10.  Wear clean pajamas.            11.  Place clean sheets on your bed the night of your first shower and do not  sleep with pets. Day of Surgery : Do not apply any lotions/deodorants the morning of surgery.  Please wear clean clothes to the hospital/surgery center.  FAILURE TO FOLLOW THESE INSTRUCTIONS MAY RESULT IN THE CANCELLATION OF YOUR SURGERY PATIENT SIGNATURE_________________________________  NURSE SIGNATURE__________________________________  ________________________________________________________________________

## 2014-04-16 NOTE — Progress Notes (Signed)
Chest x-ray 03/08/14 on [facility], EKG 03/19/14 on [facility]

## 2014-04-18 NOTE — H&P (Signed)
Chief Complaint   Patient presents with   .  Abdominal Pain   HPI  James Calhoun is a 80 y.o. male.  HPI  He is referred by Dr. Eloise HarmanPaterson for evaluation of symptomatic cholelithiasis. He has been having worsening nausea for the past month. It started out in the morning initially but now has persisted throughout the day now. He has vague right sided pain and chronic back pain. There is no nausea.  Past Medical History   Diagnosis  Date   .  Low back pain    .  Hip pain    .  ED (erectile dysfunction)    .  Arthritis    .  Osteoporosis    History reviewed. No pertinent past surgical history.  History reviewed. No pertinent family history.  Social History  History   Substance Use Topics   .  Smoking status:  Former Games developermoker   .  Smokeless tobacco:  Not on file   .  Alcohol Use:  No   No Known Allergies  Current Outpatient Prescriptions   Medication  Sig  Dispense  Refill   .  acetaminophen (TYLENOL) 500 MG tablet  Take 500 mg by mouth every 6 (six) hours as needed.     .  gabapentin (NEURONTIN) 100 MG capsule  Take 100 mg by mouth 3 (three) times daily.     Marland Kitchen.  losartan-hydrochlorothiazide (HYZAAR) 100-25 MG per tablet  Take 1 tablet by mouth daily.     .  metoprolol succinate (TOPROL-XL) 25 MG 24 hr tablet      .  metoprolol tartrate (LOPRESSOR) 25 MG tablet  Take 25 mg by mouth 2 (two) times daily.     .  Multiple Vitamins-Minerals (PRESERVISION/LUTEIN PO)  Take by mouth.     .  naproxen (NAPROSYN) 500 MG tablet  Take 500 mg by mouth 2 (two) times daily with a meal.     .  pantoprazole (PROTONIX) 40 MG tablet  Take 40 mg by mouth daily.     .  predniSONE (DELTASONE) 20 MG tablet  Take 20 mg by mouth daily with breakfast.     .  Probiotic Product (ALIGN) 4 MG CAPS  Take by mouth.     .  senna-docusate (SENOKOT-S) 8.6-50 MG per tablet  Take 1 tablet by mouth daily.     .  traMADol (ULTRAM) 50 MG tablet  Take by mouth every 6 (six) hours as needed.     .  verapamil (COVERA HS) 240 MG  (CO) 24 hr tablet  Take 240 mg by mouth at bedtime.      No current facility-administered medications for this visit.   Review of Systems  Review of Systems  Constitutional: Negative for fever, chills and unexpected weight change.  HENT: Positive for hearing loss. Negative for congestion, sore throat, trouble swallowing and voice change.  Eyes: Negative for visual disturbance.  Respiratory: Negative for cough and wheezing.  Cardiovascular: Negative for chest pain, palpitations and leg swelling.  Gastrointestinal: Positive for nausea. Negative for vomiting, abdominal pain, diarrhea, constipation, blood in stool, abdominal distention, anal bleeding and rectal pain.  Genitourinary: Negative for hematuria and difficulty urinating.  Musculoskeletal: Positive for arthralgias, back pain, gait problem, joint swelling and neck pain.  Skin: Negative for rash and wound.  Neurological: Negative for seizures, syncope, weakness and headaches.  Hematological: Negative for adenopathy. Does not bruise/bleed easily.  Psychiatric/Behavioral: Negative for confusion.  Blood pressure 133/84, pulse 71, temperature 98.8 F (37.1  C), temperature source Temporal, resp. rate 14, height 5' 4.5" (1.638 m), weight 155 lb (70.308 kg).  Physical Exam  Physical Exam  Constitutional: He is oriented to person, place, and time. He appears well-developed and well-nourished. No distress.  Elderly gentleman in NAD  HENT:  Head: Normocephalic and atraumatic.  Right Ear: External ear normal.  Left Ear: External ear normal.  Nose: Nose normal.  Eyes: Conjunctivae are normal. Pupils are equal, round, and reactive to light. No scleral icterus.  Neck: Normal range of motion. Neck supple. No tracheal deviation present.  Cardiovascular: Normal rate, regular rhythm, normal heart sounds and intact distal pulses.  No murmur heard.  Pulmonary/Chest: Effort normal and breath sounds normal. No respiratory distress. He has no wheezes. He  has no rales.  Abdominal: Soft. Bowel sounds are normal. He exhibits no distension. There is no tenderness. There is no rebound.  Musculoskeletal: Normal range of motion. He exhibits no edema and no tenderness.  Lymphadenopathy:  He has no cervical adenopathy.  Neurological: He is alert and oriented to person, place, and time.  Skin: Skin is warm and dry. No rash noted. He is not diaphoretic. No erythema.  Psychiatric: His behavior is normal. Judgment normal.  Data Reviewed  Ultrasound shows cholelithiasis with normal CBD. LFT's are normal   Assessment  Symptomatic cholelithiasis   Plan  I discussed this with the patient and his wife. They want to proceed with lap chole.  I discussed the procedure in detail. The patient was given [employer] engineered[employer]ational material. We discussed the risks and benefits of a laparoscopic cholecystectomy and possible cholangiogram including, but not limited to bleeding, infection, injury to surrounding structures such as the intestine or liver, bile leak, retained gallstones, need to convert to an open procedure, prolonged diarrhea, blood clots such as DVT, common bile duct injury, anesthesia risks, and possible need for additional procedures. They also understand that this may not resolve his symptoms at all.. We discussed the typical post-operative recovery course.

## 2014-04-19 ENCOUNTER — Encounter (HOSPITAL_COMMUNITY): Payer: Medicare Other | Admitting: Certified Registered Nurse Anesthetist

## 2014-04-19 ENCOUNTER — Observation Stay (HOSPITAL_COMMUNITY)
Admission: RE | Admit: 2014-04-19 | Discharge: 2014-04-20 | Disposition: A | Discharge status: Home / Self Care | Payer: Medicare Other | Source: Ambulatory Visit | Attending: Surgery | Admitting: Surgery

## 2014-04-19 ENCOUNTER — Encounter (HOSPITAL_COMMUNITY): Payer: Self-pay | Admitting: *Deleted

## 2014-04-19 ENCOUNTER — Encounter (HOSPITAL_COMMUNITY)
Admission: RE | Disposition: A | Discharge status: Home / Self Care | Payer: Self-pay | Source: Ambulatory Visit | Attending: Surgery

## 2014-04-19 ENCOUNTER — Ambulatory Visit (HOSPITAL_COMMUNITY): Payer: Medicare Other | Admitting: Certified Registered Nurse Anesthetist

## 2014-04-19 DIAGNOSIS — I2510 Atherosclerotic heart disease of native coronary artery without angina pectoris: Secondary | ICD-10-CM | POA: Insufficient documentation

## 2014-04-19 DIAGNOSIS — IMO0002 Reserved for concepts with insufficient information to code with codable children: Secondary | ICD-10-CM | POA: Insufficient documentation

## 2014-04-19 DIAGNOSIS — I10 Essential (primary) hypertension: Secondary | ICD-10-CM | POA: Insufficient documentation

## 2014-04-19 DIAGNOSIS — Z87891 Personal history of nicotine dependence: Secondary | ICD-10-CM | POA: Insufficient documentation

## 2014-04-19 DIAGNOSIS — I509 Heart failure, unspecified: Secondary | ICD-10-CM | POA: Insufficient documentation

## 2014-04-19 DIAGNOSIS — Z79899 Other long term (current) drug therapy: Secondary | ICD-10-CM | POA: Insufficient documentation

## 2014-04-19 DIAGNOSIS — K219 Gastro-esophageal reflux disease without esophagitis: Secondary | ICD-10-CM | POA: Insufficient documentation

## 2014-04-19 DIAGNOSIS — K8010 Calculus of gallbladder with chronic cholecystitis without obstruction: Secondary | ICD-10-CM

## 2014-04-19 DIAGNOSIS — I252 Old myocardial infarction: Secondary | ICD-10-CM | POA: Insufficient documentation

## 2014-04-19 DIAGNOSIS — K8020 Calculus of gallbladder without cholecystitis without obstruction: Secondary | ICD-10-CM | POA: Diagnosis present

## 2014-04-19 DIAGNOSIS — M810 Age-related osteoporosis without current pathological fracture: Secondary | ICD-10-CM | POA: Insufficient documentation

## 2014-04-19 HISTORY — PX: CHOLECYSTECTOMY: SHX55

## 2014-04-19 SURGERY — LAPAROSCOPIC CHOLECYSTECTOMY
Anesthesia: General | Site: Abdomen

## 2014-04-19 MED ORDER — LOSARTAN POTASSIUM 50 MG PO TABS
100.0000 mg | ORAL_TABLET | Freq: Every day | ORAL | Status: DC
  Administered 2014-04-19: 100 mg via ORAL
  Filled 2014-04-19 (×2): qty 2

## 2014-04-19 MED ORDER — ROCURONIUM BROMIDE 100 MG/10ML IV SOLN
INTRAVENOUS | Status: DC | PRN
  Administered 2014-04-19: 20 mg via INTRAVENOUS

## 2014-04-19 MED ORDER — HYDROCODONE-ACETAMINOPHEN 5-325 MG PO TABS: 1.0000 | ORAL_TABLET | Freq: Four times a day (QID) | ORAL | Status: DC | PRN

## 2014-04-19 MED ORDER — FENTANYL CITRATE 0.05 MG/ML IJ SOLN
INTRAMUSCULAR | Status: DC | PRN
  Administered 2014-04-19: 50 ug via INTRAVENOUS
  Administered 2014-04-19: 25 ug via INTRAVENOUS

## 2014-04-19 MED ORDER — BUPIVACAINE HCL (PF) 0.5 % IJ SOLN
INTRAMUSCULAR | Status: AC
  Filled 2014-04-19: qty 30

## 2014-04-19 MED ORDER — ENOXAPARIN SODIUM 30 MG/0.3ML ~~LOC~~ SOLN
30.0000 mg | SUBCUTANEOUS | Status: DC
  Administered 2014-04-20: 30 mg via SUBCUTANEOUS
  Filled 2014-04-19 (×2): qty 0.3

## 2014-04-19 MED ORDER — NEOSTIGMINE METHYLSULFATE 10 MG/10ML IV SOLN
INTRAVENOUS | Status: DC | PRN
  Administered 2014-04-19: 4 mg via INTRAVENOUS

## 2014-04-19 MED ORDER — LOSARTAN POTASSIUM-HCTZ 100-25 MG PO TABS: 1.0000 | ORAL_TABLET | Freq: Every day | ORAL | Status: DC

## 2014-04-19 MED ORDER — GLYCOPYRROLATE 0.2 MG/ML IJ SOLN
INTRAMUSCULAR | Status: DC | PRN
  Administered 2014-04-19: 0.6 mg via INTRAVENOUS

## 2014-04-19 MED ORDER — PROMETHAZINE HCL 25 MG/ML IJ SOLN: 6.2500 mg | INTRAMUSCULAR | Status: DC | PRN

## 2014-04-19 MED ORDER — OXYCODONE HCL 5 MG/5ML PO SOLN
5.0000 mg | Freq: Once | ORAL | Status: DC | PRN
  Filled 2014-04-19: qty 5

## 2014-04-19 MED ORDER — EPHEDRINE SULFATE 50 MG/ML IJ SOLN
INTRAMUSCULAR | Status: AC
  Filled 2014-04-19: qty 1

## 2014-04-19 MED ORDER — FENTANYL CITRATE 0.05 MG/ML IJ SOLN
INTRAMUSCULAR | Status: AC
  Filled 2014-04-19: qty 5

## 2014-04-19 MED ORDER — LIDOCAINE HCL (CARDIAC) 20 MG/ML IV SOLN
INTRAVENOUS | Status: DC | PRN
  Administered 2014-04-19: 80 mg via INTRAVENOUS

## 2014-04-19 MED ORDER — PROPOFOL 10 MG/ML IV BOLUS
INTRAVENOUS | Status: DC | PRN
  Administered 2014-04-19: 120 mg via INTRAVENOUS

## 2014-04-19 MED ORDER — METOPROLOL SUCCINATE ER 25 MG PO TB24
25.0000 mg | ORAL_TABLET | Freq: Every day | ORAL | Status: DC
  Filled 2014-04-19: qty 1

## 2014-04-19 MED ORDER — SODIUM CHLORIDE 0.9 % IJ SOLN
INTRAMUSCULAR | Status: AC
  Filled 2014-04-19: qty 10

## 2014-04-19 MED ORDER — HYDROCHLOROTHIAZIDE 25 MG PO TABS
25.0000 mg | ORAL_TABLET | Freq: Every day | ORAL | Status: DC
  Administered 2014-04-19: 25 mg via ORAL
  Filled 2014-04-19 (×2): qty 1

## 2014-04-19 MED ORDER — GLYCOPYRROLATE 0.2 MG/ML IJ SOLN
INTRAMUSCULAR | Status: AC
  Filled 2014-04-19: qty 3

## 2014-04-19 MED ORDER — VERAPAMIL HCL ER 240 MG PO TBCR
240.0000 mg | EXTENDED_RELEASE_TABLET | Freq: Every day | ORAL | Status: DC
  Administered 2014-04-19: 240 mg via ORAL
  Filled 2014-04-19 (×2): qty 1

## 2014-04-19 MED ORDER — LIDOCAINE HCL (CARDIAC) 20 MG/ML IV SOLN
INTRAVENOUS | Status: AC
  Filled 2014-04-19: qty 5

## 2014-04-19 MED ORDER — POLYVINYL ALCOHOL 1.4 % OP SOLN
1.0000 [drp] | Freq: Every day | OPHTHALMIC | Status: DC | PRN
  Filled 2014-04-19: qty 15

## 2014-04-19 MED ORDER — IBUPROFEN 600 MG PO TABS
600.0000 mg | ORAL_TABLET | Freq: Four times a day (QID) | ORAL | Status: DC | PRN
  Filled 2014-04-19: qty 1

## 2014-04-19 MED ORDER — OXYCODONE HCL 5 MG PO TABS: 5.0000 mg | ORAL_TABLET | Freq: Once | ORAL | Status: DC | PRN

## 2014-04-19 MED ORDER — MORPHINE SULFATE 2 MG/ML IJ SOLN: 1.0000 mg | INTRAMUSCULAR | Status: DC | PRN

## 2014-04-19 MED ORDER — SUCCINYLCHOLINE CHLORIDE 20 MG/ML IJ SOLN
INTRAMUSCULAR | Status: DC | PRN
  Administered 2014-04-19: 100 mg via INTRAVENOUS

## 2014-04-19 MED ORDER — POTASSIUM CHLORIDE IN NACL 20-0.9 MEQ/L-% IV SOLN
INTRAVENOUS | Status: DC
  Administered 2014-04-19: 15:00:00 via INTRAVENOUS
  Filled 2014-04-19 (×2): qty 1000

## 2014-04-19 MED ORDER — BUPIVACAINE HCL (PF) 0.5 % IJ SOLN
INTRAMUSCULAR | Status: DC | PRN
  Administered 2014-04-19: 20 mL

## 2014-04-19 MED ORDER — MEPERIDINE HCL 50 MG/ML IJ SOLN
6.2500 mg | INTRAMUSCULAR | Status: DC | PRN
Start: 2014-04-19 — End: 2014-04-19

## 2014-04-19 MED ORDER — LACTATED RINGERS IV SOLN
INTRAVENOUS | Status: DC | PRN
  Administered 2014-04-19 (×2): via INTRAVENOUS

## 2014-04-19 MED ORDER — CEFAZOLIN SODIUM-DEXTROSE 2-3 GM-% IV SOLR
2.0000 g | INTRAVENOUS | Status: AC
  Administered 2014-04-19: 2 g via INTRAVENOUS

## 2014-04-19 MED ORDER — HYDROCODONE-ACETAMINOPHEN 5-325 MG PO TABS
1.0000 | ORAL_TABLET | ORAL | Status: DC | PRN
  Administered 2014-04-19: 2 via ORAL
  Administered 2014-04-19: 1 via ORAL
  Filled 2014-04-19: qty 2
  Filled 2014-04-19: qty 1

## 2014-04-19 MED ORDER — LACTATED RINGERS IV SOLN
INTRAVENOUS | Status: DC | PRN
Start: 2014-04-19 — End: 2014-04-19
  Administered 2014-04-19: 1

## 2014-04-19 MED ORDER — HYDROMORPHONE HCL PF 1 MG/ML IJ SOLN: 0.2500 mg | INTRAMUSCULAR | Status: DC | PRN

## 2014-04-19 MED ORDER — CEFAZOLIN SODIUM-DEXTROSE 2-3 GM-% IV SOLR
INTRAVENOUS | Status: AC
  Filled 2014-04-19: qty 50

## 2014-04-19 SURGICAL SUPPLY — 33 items
APPLIER CLIP 5 13 M/L LIGAMAX5 (MISCELLANEOUS) ×3
BANDAGE ADH SHEER 1  50/CT (GAUZE/BANDAGES/DRESSINGS) ×12 IMPLANT
BENZOIN TINCTURE PRP APPL 2/3 (GAUZE/BANDAGES/DRESSINGS) ×3 IMPLANT
CANISTER SUCTION 2500CC (MISCELLANEOUS) IMPLANT
CHLORAPREP W/TINT 26ML (MISCELLANEOUS) ×3 IMPLANT
CLIP APPLIE 5 13 M/L LIGAMAX5 (MISCELLANEOUS) ×1 IMPLANT
CLOSURE WOUND 1/2 X4 (GAUZE/BANDAGES/DRESSINGS) ×1
COVER MAYO STAND STRL (DRAPES) IMPLANT
DECANTER SPIKE VIAL GLASS SM (MISCELLANEOUS) ×3 IMPLANT
DRAPE C-ARM 42X120 X-RAY (DRAPES) IMPLANT
DRAPE LAPAROSCOPIC ABDOMINAL (DRAPES) ×3 IMPLANT
DRAPE UTILITY XL STRL (DRAPES) ×3 IMPLANT
ELECT REM PT RETURN 9FT ADLT (ELECTROSURGICAL) ×3
ELECTRODE REM PT RTRN 9FT ADLT (ELECTROSURGICAL) ×1 IMPLANT
GLOVE SURG SIGNA 7.5 PF LTX (GLOVE) ×3 IMPLANT
GOWN STRL REUS W/TWL XL LVL3 (GOWN DISPOSABLE) ×6 IMPLANT
HEMOSTAT SURGICEL 4X8 (HEMOSTASIS) IMPLANT
KIT BASIN OR (CUSTOM PROCEDURE TRAY) ×3 IMPLANT
NS IRRIG 1000ML POUR BTL (IV SOLUTION) ×3 IMPLANT
POUCH SPECIMEN RETRIEVAL 10MM (ENDOMECHANICALS) ×3 IMPLANT
SCISSORS LAP 5X35 DISP (ENDOMECHANICALS) ×3 IMPLANT
SET CHOLANGIOGRAPH MIX (MISCELLANEOUS) IMPLANT
SET IRRIG TUBING LAPAROSCOPIC (IRRIGATION / IRRIGATOR) ×3 IMPLANT
SOLUTION ANTI FOG 6CC (MISCELLANEOUS) ×3 IMPLANT
STRIP CLOSURE SKIN 1/2X4 (GAUZE/BANDAGES/DRESSINGS) ×2 IMPLANT
SUT MNCRL AB 4-0 PS2 18 (SUTURE) ×3 IMPLANT
TOWEL OR 17X26 10 PK STRL BLUE (TOWEL DISPOSABLE) ×3 IMPLANT
TOWEL OR NON WOVEN STRL DISP B (DISPOSABLE) ×3 IMPLANT
TRAY LAP CHOLE (CUSTOM PROCEDURE TRAY) ×3 IMPLANT
TROCAR BLADELESS OPT 5 75 (ENDOMECHANICALS) ×3 IMPLANT
TROCAR SLEEVE XCEL 5X75 (ENDOMECHANICALS) ×6 IMPLANT
TROCAR XCEL BLUNT TIP 100MML (ENDOMECHANICALS) ×3 IMPLANT
TUBING INSUFFLATION 10FT LAP (TUBING) ×3 IMPLANT

## 2014-04-19 NOTE — Anesthesia Postprocedure Evaluation (Signed)
Anesthesia Post Note  Patient: James Calhoun  Procedure(s) Performed: Procedure(s) (LRB): LAPAROSCOPIC CHOLECYSTECTOMY (N/A)  Anesthesia type: General  Patient location: PACU  Post pain: Pain level controlled  Post assessment: Post-op Vital signs reviewed  Last Vitals: BP 197/106  Pulse 44  Temp(Src) 36.4 C (Oral)  Resp 14  Ht 5\' 6"  (1.676 m)  Wt 159 lb (72.122 kg)  BMI 25.68 kg/m2  SpO2 98%  Post vital signs: Reviewed  Level of consciousness: sedated  Complications: No apparent anesthesia complications

## 2014-04-19 NOTE — Discharge Instructions (Signed)
[facility] ______[facility] °LAPAROSCOPIC SURGERY: POST OP INSTRUCTIONS °Always review your discharge instruction sheet given to you by the facility where your surgery was performed. °IF YOU HAVE DISABILITY OR FAMILY LEAVE FORMS, YOU MUST BRING THEM TO THE OFFICE FOR PROCESSING.   °DO NOT GIVE THEM TO YOUR DOCTOR. ° °1. A prescription for pain medication may be given to you upon discharge.  Take your pain medication as prescribed, if needed.  If narcotic pain medicine is not needed, then you may take acetaminophen (Tylenol) or ibuprofen (Advil) as needed. °2. Take your usually prescribed medications unless otherwise directed. °3. If you need a refill on your pain medication, please contact your pharmacy.  They will contact our office to request authorization. Prescriptions will not be filled after 5pm or on week-ends. °4. You should follow a light diet the first few days after arrival home, such as soup and crackers, etc.  Be sure to include lots of fluids daily. °5. Most patients will experience some swelling and bruising in the area of the incisions.  Ice packs will help.  Swelling and bruising can take several days to resolve.  °6. It is common to experience some constipation if taking pain medication after surgery.  Increasing fluid intake and taking a stool softener (such as Colace) will usually help or prevent this problem from occurring.  A mild laxative (Milk of Magnesia or Miralax) should be taken according to package instructions if there are no bowel movements after 48 hours. °7. Unless discharge instructions indicate otherwise, you may remove your bandages 24-48 hours after surgery, and you may shower at that time.  You may have steri-strips (small skin tapes) in place directly over the incision.  These strips should be left on the skin for 7-10 days.  If your surgeon used skin glue on the incision, you may shower in 24 hours.  The glue will flake off over the next 2-3 weeks.  Any sutures or  staples will be removed at the office during your follow-up visit. °8. ACTIVITIES:  You may resume regular (light) daily activities beginning the next day--such as daily self-care, walking, climbing stairs--gradually increasing activities as tolerated.  You may have sexual intercourse when it is comfortable.  Refrain from any heavy lifting or straining until approved by your doctor. °a. You may drive when you are no longer taking prescription pain medication, you can comfortably wear a seatbelt, and you can safely maneuver your car and apply brakes. °b. RETURN TO WORK:  __________________________________________________________ °9. You should see your doctor in the office for a follow-up appointment approximately 2-3 weeks after your surgery.  Make sure that you call for this appointment within a day or two after you arrive home to insure a convenient appointment time. °10. OTHER INSTRUCTIONS: __________________________________________________________________________________________________________________________ __________________________________________________________________________________________________________________________ °WHEN TO CALL YOUR DOCTOR: °1. Fever over 101.0 °2. Inability to urinate °3. Continued bleeding from incision. °4. Increased pain, redness, or drainage from the incision. °5. Increasing abdominal pain ° °The clinic staff is available to answer your questions during regular business hours.  Please don’t hesitate to call and ask to speak to one of the nurses for clinical concerns.  If you have a medical emergency, go to the nearest emergency room or call 911.  A surgeon from [facility] is always on call at the hospital. °[address], [address], [place], [place]  [address] ? [address] [address], , [place]   [address] °[phone] ? [phone] ? [phone] [phone] °Web site:   [email or web address] ° ° °Post Anesthesia Home Care Instructions ° °Activity: °Get  plenty of rest for the remainder of the day. A responsible adult should stay with you for 24 hours following the procedure.  °For the next 24 hours, DO NOT: °-Drive a car °-Operate machinery °-Drink alcoholic beverages °-Take any medication unless instructed by your physician °-Make any legal decisions or sign important papers. ° °Meals: °Start with liquid foods such as gelatin or soup. Progress to regular foods as tolerated. Avoid greasy, spicy, heavy foods. If nausea and/or vomiting occur, drink only clear liquids until the nausea and/or vomiting subsides. Call your physician if vomiting continues. ° °Special Instructions/Symptoms: °Your throat may feel dry or sore from the anesthesia or the breathing tube placed in your throat during surgery. If this causes discomfort, gargle with warm salt water. The discomfort should disappear within 24 hours. ° °

## 2014-04-19 NOTE — Transfer of Care (Signed)
Immediate Anesthesia Transfer of Care Note  Patient: James Calhoun  Procedure(s) Performed: Procedure(s): LAPAROSCOPIC CHOLECYSTECTOMY (N/A)  Patient Location: PACU  Anesthesia Type:General  Level of Consciousness: awake and alert   Airway & Oxygen Therapy: Patient Spontanous Breathing and Patient connected to face mask oxygen  Post-op Assessment: Report given to PACU RN and Post -op Vital signs reviewed and stable  Post vital signs: Reviewed and stable  Complications: No apparent anesthesia complications

## 2014-04-19 NOTE — Telephone Encounter (Signed)
Encounter complete. 

## 2014-04-19 NOTE — Interval H&P Note (Signed)
History and Physical Interval Note: no change in H and P  04/19/2014 9:10 AM  James Calhoun  has presented today for surgery, with the diagnosis of symptomic chole  The various methods of treatment have been discussed with the patient and family. After consideration of risks, benefits and other options for treatment, the patient has consented to  Procedure(s): LAPAROSCOPIC CHOLECYSTECTOMY (N/A) as a surgical intervention .  The patient's history has been reviewed, patient examined, no change in status, stable for surgery.  I have reviewed the patient's chart and labs.  Questions were answered to the patient's satisfaction.     Michaele Amundson A

## 2014-04-19 NOTE — Op Note (Signed)
Laparoscopic Cholecystectomy Procedure Note  Indications: This patient presents with symptomatic gallbladder disease and will undergo laparoscopic cholecystectomy.  Pre-operative Diagnosis: Calculus of gallbladder without mention of cholecystitis or obstruction  Post-operative Diagnosis: Same  Surgeon: Abigail MiyamotoBLACKMAN,Roxanne Orner A   Assistants: 0  Anesthesia: General endotracheal anesthesia  ASA Class: 2  Procedure Details  The patient was seen again in the Holding Room. The risks, benefits, complications, treatment options, and expected outcomes were discussed with the patient. The possibilities of reaction to medication, pulmonary aspiration, perforation of viscus, bleeding, recurrent infection, finding a normal gallbladder, the need for additional procedures, failure to diagnose a condition, the possible need to convert to an open procedure, and creating a complication requiring transfusion or operation were discussed with the patient. The likelihood of improving the patient's symptoms with return to their baseline status is good.  The patient and/or family concurred with the proposed plan, giving informed consent. The site of surgery properly noted. The patient was taken to Operating Room, identified as James Calhoun and the procedure verified as Laparoscopic Cholecystectomy with Intraoperative Cholangiogram. A Time Out was held and the above information confirmed.  Prior to the induction of general anesthesia, antibiotic prophylaxis was administered. General endotracheal anesthesia was then administered and tolerated well. After the induction, the abdomen was prepped with Chloraprep and draped in sterile fashion. The patient was positioned in the supine position.  Local anesthetic agent was injected into the skin near the umbilicus and an incision made. We dissected down to the abdominal fascia with blunt dissection.  The fascia was incised vertically and we entered the peritoneal cavity bluntly.  A  pursestring suture of 0-Vicryl was placed around the fascial opening.  The Hasson cannula was inserted and secured with the stay suture.  Pneumoperitoneum was then created with CO2 and tolerated well without any adverse changes in the patient's vital signs. An 11-mm port was placed in the subxiphoid position.  Two 5-mm ports were placed in the right upper quadrant. All skin incisions were infiltrated with a local anesthetic agent before making the incision and placing the trocars.   We positioned the patient in reverse Trendelenburg, tilted slightly to the patient's left.  The gallbladder was identified, the fundus grasped and retracted cephalad. Adhesions were lysed bluntly and with the electrocautery where indicated, taking care not to injure any adjacent organs or viscus. The infundibulum was grasped and retracted laterally, exposing the peritoneum overlying the triangle of Calot. This was then divided and exposed in a blunt fashion. The cystic duct was clearly identified and bluntly dissected circumferentially. A critical view of the cystic duct and cystic artery was obtained.  The cystic duct was then ligated with clips and divided. The cystic artery was, dissected free, ligated with clips and divided as well.   The gallbladder was dissected from the liver bed in retrograde fashion with the electrocautery. The gallbladder was removed and placed in an Endocatch sac. The liver bed was irrigated and inspected. Hemostasis was achieved with the electrocautery. Copious irrigation was utilized and was repeatedly aspirated until clear.  The gallbladder and Endocatch sac were then removed through the umbilical port site.  The pursestring suture was used to close the umbilical fascia.    We again inspected the right upper quadrant for hemostasis.  Pneumoperitoneum was released as we removed the trocars.  4-0 Monocryl was used to close the skin.   Benzoin, steri-strips, and clean dressings were applied. The patient  was then extubated and brought to the recovery room in  stable condition. Instrument, sponge, and needle counts were correct at closure and at the conclusion of the case.   Findings: Cholecystitis with Cholelithiasis  Estimated Blood Loss: Minimal         Drains: 0         Specimens: Gallbladder           Complications: None; patient tolerated the procedure well.         Disposition: PACU - hemodynamically stable.         Condition: stable

## 2014-04-19 NOTE — Anesthesia Preprocedure Evaluation (Addendum)
Anesthesia Evaluation  Patient identified by MRN, date of birth, ID band Patient awake    Reviewed: Allergy & Precautions, H&P , NPO status , Patient's Chart, lab work & pertinent test results, reviewed documented beta blocker date and time   Airway Mallampati: II TM Distance: >3 FB Neck ROM: Full    Dental  (+) Dental Advisory Given   Pulmonary neg pulmonary ROS, former smoker,  breath sounds clear to auscultation        Cardiovascular hypertension, Pt. on medications and Pt. on home beta blockers + CAD, + Past MI and +CHF Rhythm:Regular Rate:Normal  Echo 04/11/2014 - Left ventricle: The cavity size was normal. Wall thickness was  increased in a pattern of mild LVH. Systolic function was mildly  reduced. The estimated ejection fraction was in the range of 45%  to 50%. The apical septal and apical inferior walls and the true  apex were akinetic. Doppler parameters are consistent with  abnormal left ventricular relaxation (grade 1 diastolic  dysfunction). - Aortic valve: Trileaflet; moderately calcified leaflets.   Sclerosis without stenosis. - Mitral valve: Mildly calcified annulus. Mildly calcified leaflets. There was trivial regurgitation. - Left atrium: The atrium was mildly dilated. - Right ventricle: The cavity size was normal. Systolic function   was normal. - Right atrium: The atrium was mildly dilated. - Pulmonary arteries: No complete TR doppler jet so unable to  estimate PA systolic pressure. - Inferior vena cava: The vessel was normal in size. The   respirophasic diameter changes were in the normal range (= 50%),  consistent with normal central venous pressure.  Impressions: - Normal LV size with mild LV hypertrophy. EF 45-50% with wall  motion abnormalities as noted above. Normal RV size and systolic  function. Aortic sclerosis wtihout significant stenosis.    Neuro/Psych negative neurological ROS  negative psych ROS   GI/Hepatic Neg liver ROS, GERD-  ,  Endo/Other  negative endocrine ROS  Renal/GU negative Renal ROS     Musculoskeletal negative musculoskeletal ROS (+)   Abdominal   Peds  Hematology negative hematology ROS (+)   Anesthesia Other Findings   Reproductive/Obstetrics negative OB ROS                          Anesthesia Physical Anesthesia Plan  ASA: III  Anesthesia Plan: General   Post-op Pain Management:    Induction: Intravenous  Airway Management Planned: Oral ETT  Additional Equipment:   Intra-op Plan:   Post-operative Plan: Extubation in OR  Informed Consent: I have reviewed the patients History and Physical, chart, labs and discussed the procedure including the risks, benefits and alternatives for the proposed anesthesia with the patient or authorized representative who has indicated his/her understanding and acceptance.   Dental advisory given  Plan Discussed with: CRNA  Anesthesia Plan Comments:         Anesthesia Quick Evaluation

## 2014-04-20 NOTE — Progress Notes (Signed)
1 Day Post-Op  Subjective: Doing well, tol diet, voiding  Objective: Vital signs in last 24 hours: Temp:  [97.5 F (36.4 C)-98.9 F (37.2 C)] 98.5 F (36.9 C) (07/03 0559) Pulse Rate:  [44-86] 84 (07/03 0559) Resp:  [11-27] 16 (07/03 0559) BP: (126-197)/(57-106) 154/77 mmHg (07/03 0559) SpO2:  [92 %-100 %] 93 % (07/03 0559) Weight:  [152 lb (68.947 kg)-159 lb (72.122 kg)] 152 lb (68.947 kg) (07/02 1802) Last BM Date: 04/19/14  Intake/Output from previous day: 07/02 0701 - 07/03 0700 In: 1655.8 [P.O.:120; I.V.:1535.8] Out: 1000 [Urine:1000] Intake/Output this shift: Total I/O In: -  Out: 100 [Urine:100]  General appearance: no distress Resp: clear to auscultation bilaterally Cardio: regular rate and rhythm GI: soft approp tender incisions clean   Anti-infectives: Anti-infectives   Start     Dose/Rate Route Frequency Ordered Stop   04/19/14 0915  ceFAZolin (ANCEF) IVPB 2 g/50 mL premix     2 g 100 mL/hr over 30 Minutes Intravenous On call to O.R. 04/19/14 0915 04/19/14 1021      Assessment/Plan: POD 1 lap chole  Dc home today  [facility] HospitalWAKEFIELD,Marcial Pless 04/20/2014

## 2014-04-23 ENCOUNTER — Encounter (HOSPITAL_COMMUNITY): Payer: Self-pay | Admitting: Surgery

## 2014-05-18 ENCOUNTER — Ambulatory Visit (INDEPENDENT_AMBULATORY_CARE_PROVIDER_SITE_OTHER): Payer: Medicare Other | Admitting: Surgery

## 2014-05-18 ENCOUNTER — Encounter (INDEPENDENT_AMBULATORY_CARE_PROVIDER_SITE_OTHER): Payer: Self-pay | Admitting: Surgery

## 2014-05-18 VITALS — BP 126/80 | HR 73 | Temp 98.0°F | Ht 65.0 in | Wt 155.0 lb

## 2014-05-18 DIAGNOSIS — Z09 Encounter for follow-up examination after completed treatment for conditions other than malignant neoplasm: Secondary | ICD-10-CM

## 2014-05-18 NOTE — Progress Notes (Signed)
Subjective:     Patient ID: James Calhoun, male   DOB: [birth year], 80 y.o.   MRN: [id number]  HPI He is here for his first postop visit status post laparoscopic cholecystectomy. He is doing well. It only relieved some of his back pain but not all of it as expected. He is eating well and moving his bowels well.  Review of Systems     Objective:   Physical Exam On exam, his incisions are well healed. His abdomen is soft. The final pathology showed chronic cholecystitis with gallstones    Assessment:     Patient stable postop     Plan:      from a surgical standpoint, he may resume his normal activity. I will see him back as needed

## 2014-07-11 ENCOUNTER — Encounter: Payer: Self-pay | Admitting: Internal Medicine

## 2014-10-05 ENCOUNTER — Ambulatory Visit (INDEPENDENT_AMBULATORY_CARE_PROVIDER_SITE_OTHER): Payer: Medicare Other | Admitting: Internal Medicine

## 2014-10-05 ENCOUNTER — Encounter: Payer: Self-pay | Admitting: Internal Medicine

## 2014-10-05 VITALS — BP 148/62 | HR 69 | Ht 66.0 in | Wt 159.0 lb

## 2014-10-05 DIAGNOSIS — I10 Essential (primary) hypertension: Secondary | ICD-10-CM

## 2014-10-05 DIAGNOSIS — I498 Other specified cardiac arrhythmias: Secondary | ICD-10-CM

## 2014-10-05 DIAGNOSIS — I252 Old myocardial infarction: Secondary | ICD-10-CM

## 2014-10-05 DIAGNOSIS — I499 Cardiac arrhythmia, unspecified: Secondary | ICD-10-CM

## 2014-10-05 DIAGNOSIS — I255 Ischemic cardiomyopathy: Secondary | ICD-10-CM

## 2014-10-05 NOTE — Patient Instructions (Signed)
Your physician wants you to follow-up in: 6 months with Dr. Rennis GoldenHilty. You will receive a reminder letter in the mail two months in advance. If you don't receive a letter, please call our office to schedule the follow-up appointment.  Happy Holidays!

## 2014-10-05 NOTE — Progress Notes (Signed)
OFFICE NOTE  Chief Complaint:  No complaints  Primary Care Physician: Garlan FillersPATERSON,DANIEL G, MD  HPI:  James Calhoun is an 80 year old male has been having some recent abdominal pain and was diagnosed with cholelithiasis. He was scheduled to have elective cholecystectomy and a preoperative EKG demonstrated bigeminal PVCs. He is unaware of these PVCs. In fact he does not report any chest pain or shortness of breath with exertion. He denies any new fatigue or change in his exercise tolerance. His past medical history significant for hypertension, but no known coronary artery disease, no diabetes and no evidence for dyslipidemia.  An EKG in the office today again shows bigeminal PVCs with a heart rate of 57 and a possible left atrial enlargement.  His stress test indicated a dense inferoapical defect suggestive of scar. The nuclear stress test was non-gated. He subsequently underwent an echocardiogram which showed infero-apical akinesis and an EF of 45-50%. 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 He is unaware of this event. There was no evidence for ischemia. He is on good medications for heart failure including metoprolol and losartan.   James Calhoun returns today for follow-up. He underwent successful surgery in July has had no issues. He continues to have problems with bad arthritis but denies chest pain or worsening shortness of breath. His EKG shows persistent ventricular bigeminy, despite his beta blocker.  PMHx:  Past Medical History  Diagnosis Date  . Low back pain   . Hip pain   . ED (erectile dysfunction)   . Arthritis   . Osteoporosis   . Hypertension   . GERD (gastroesophageal reflux disease)   . Thin skin     bruises easy    Past Surgical History  Procedure Laterality Date  . Colonscopy  06-28-2008  . Eye surgery Bilateral yrs ago    cataract lens replacments  . Cholecystectomy N/A 04/19/2014    Procedure: LAPAROSCOPIC CHOLECYSTECTOMY;  Surgeon: Shelly Rubensteinouglas A Blackman, MD;   Location: [facility];  Service: General;  Laterality: N/A;    FAMHx:  Family History  Problem Relation Age of Onset  . Cancer Mother   . Heart disease Father   . Heart disease Brother     SOCHx:   reports that he quit smoking about 45 years ago. His smoking use included Cigarettes. He has a 7.5 pack-year smoking history. He has never used smokeless tobacco. He reports that he does not drink alcohol or use illicit drugs.  ALLERGIES:  No Known Allergies  ROS: A comprehensive review of systems was negative except for: Musculoskeletal: positive for arthralgias  HOME MEDS: Current Outpatient Prescriptions  Medication Sig Dispense Refill  . acetaminophen (TYLENOL) 500 MG tablet Take 500 mg by mouth at bedtime as needed (pain).     Marland Kitchen. aspirin 81 MG tablet Take 81 mg by mouth daily.    . Carboxymethylcellul-Glycerin (OPTIVE) 0.5-0.9 % SOLN Place 1 drop into both eyes daily as needed (dry eyes).    Marland Kitchen. losartan-hydrochlorothiazide (HYZAAR) 100-25 MG per tablet Take 1 tablet by mouth daily.     . metoprolol succinate (TOPROL-XL) 25 MG 24 hr tablet Take 25 mg by mouth daily.     . Multiple Vitamins-Minerals (PRESERVISION/LUTEIN PO) Take 1 tablet by mouth daily.     . naproxen (NAPROSYN) 500 MG tablet Take 500 mg by mouth 2 (two) times daily with a meal.    . Probiotic Product (ALIGN) 4 MG CAPS Take 4 mg by mouth daily.     . traMADol (  ULTRAM) 50 MG tablet Take 50 mg by mouth at bedtime.     . verapamil (COVERA HS) 240 MG (CO) 24 hr tablet Take 240 mg by mouth at bedtime.     No current facility-administered medications for this visit.    LABS/IMAGING: No results found for this or any previous visit (from the past 48 hour(s)). No results found.  VITALS: BP 148/62 mmHg  Pulse 69  Ht 5\' 6"  (1.676 m)  Wt 159 lb (72.122 kg)  BMI 25.68 kg/m2  EXAM: General appearance: alert and no distress Neck: no carotid bruit and no JVD Lungs: clear to auscultation bilaterally Heart: regular rate and  rhythm, S1, S2 normal, no murmur, click, rub or gallop Abdomen: soft, non-tender; bowel sounds normal; no masses,  no organomegaly Extremities: extremities normal, atraumatic, no cyanosis or edema Pulses: 2+ and symmetric Skin: Skin color, texture, turgor normal. No rashes or lesions Neurologic: Grossly normal Psych: Normal  EKG: Sinus rhythm at 69 with bigeminal PVCs, incomplete right bundle branch block  ASSESSMENT: 1. Low to intermediate risk for surgery 2. Ischemic cardiomyopathy, EF 45-50%, inferoapical scar 3. 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   James Calhoun continues to be asymptomatic. He is on appropriate medications for his ischemic cardiomyopathy. His bigeminal PVCs persisted beta blocker and are nonconducted therefore his heart rate is in the low 40s. He seems to be asymptomatic with this. I do not feel there is room to up titrate his medications at this time. There is no indication for intervention. We'll continue his current medications.  Plan to see him back in 6 months.   Chrystie NoseKenneth C. Hilty, MD, [facility] GilbertFACC Attending Cardiologist [facility]  HILTY,Kenneth C 10/05/2014, 8:30 AM

## 2014-11-19 ENCOUNTER — Other Ambulatory Visit: Payer: Self-pay | Admitting: Physical Medicine and Rehabilitation

## 2014-11-19 DIAGNOSIS — M545 Low back pain: Secondary | ICD-10-CM

## 2014-11-25 ENCOUNTER — Ambulatory Visit
Admission: RE | Admit: 2014-11-25 | Discharge: 2014-11-25 | Disposition: A | Discharge status: Home / Self Care | Payer: Self-pay | Source: Ambulatory Visit | Attending: Physical Medicine and Rehabilitation | Admitting: Physical Medicine and Rehabilitation

## 2014-11-25 DIAGNOSIS — M545 Low back pain: Secondary | ICD-10-CM

## 2015-04-02 ENCOUNTER — Encounter: Payer: Self-pay | Admitting: Internal Medicine

## 2015-04-02 ENCOUNTER — Ambulatory Visit (INDEPENDENT_AMBULATORY_CARE_PROVIDER_SITE_OTHER): Payer: Medicare Other | Admitting: Internal Medicine

## 2015-04-02 VITALS — BP 138/66 | HR 66 | Ht 64.5 in | Wt 148.5 lb

## 2015-04-02 DIAGNOSIS — I499 Cardiac arrhythmia, unspecified: Secondary | ICD-10-CM | POA: Diagnosis not present

## 2015-04-02 DIAGNOSIS — I10 Essential (primary) hypertension: Secondary | ICD-10-CM

## 2015-04-02 DIAGNOSIS — I255 Ischemic cardiomyopathy: Secondary | ICD-10-CM

## 2015-04-02 DIAGNOSIS — I252 Old myocardial infarction: Secondary | ICD-10-CM

## 2015-04-02 DIAGNOSIS — I498 Other specified cardiac arrhythmias: Secondary | ICD-10-CM

## 2015-04-02 DIAGNOSIS — R5383 Other fatigue: Secondary | ICD-10-CM | POA: Diagnosis not present

## 2015-04-02 NOTE — Progress Notes (Signed)
OFFICE NOTE  Chief Complaint:  Fatigue  Primary Care Physician: Garlan Fillers, MD  HPI:  James Calhoun is an 81 year old male has been having some recent abdominal pain and was diagnosed with cholelithiasis. He was scheduled to have elective cholecystectomy and a preoperative EKG demonstrated bigeminal PVCs. He is unaware of these PVCs. In fact he does not report any chest pain or shortness of breath with exertion. He denies any new fatigue or change in his exercise tolerance. His past medical history significant for hypertension, but no known coronary artery disease, no diabetes and no evidence for dyslipidemia.  An EKG in the office today again shows bigeminal PVCs with a heart rate of 57 and a possible left atrial enlargement.  His stress test indicated a dense inferoapical defect suggestive of scar. The nuclear stress test was non-gated. He subsequently underwent an echocardiogram which showed infero-apical akinesis and an EF of 45-50%. 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 He is unaware of this event. There was no evidence for ischemia. He is on good medications for heart failure including metoprolol and losartan.   James Calhoun returns today for follow-up. He underwent successful surgery in July has had no issues. He continues to have problems with bad arthritis but denies chest pain or worsening shortness of breath. His EKG shows persistent ventricular bigeminy, despite his beta blocker.  I saw James Calhoun back in the office today. He continues to be in ventricular bigeminy. He is not reporting more fatigue and decreased exercise tolerance. He seems to chalk it up to being old, however, heart rate is quite low as his PVCs are nonconducted. Prior attempts to suppress them with the beta blocker been unsuccessful. Decrease in the beta blocker does not seem to help with the PVCs either.  PMHx:  Past Medical History  Diagnosis Date  . Low back pain   . Hip pain   . ED (erectile  dysfunction)   . Arthritis   . Osteoporosis   . Hypertension   . GERD (gastroesophageal reflux disease)   . Thin skin     bruises easy    Past Surgical History  Procedure Laterality Date  . Colonscopy  06-28-2008  . Eye surgery Bilateral yrs ago    cataract lens replacments  . Cholecystectomy N/A 04/19/2014    Procedure: LAPAROSCOPIC CHOLECYSTECTOMY;  Surgeon: Shelly Rubenstein, MD;  Location: [facility];  Service: General;  Laterality: N/A;    FAMHx:  Family History  Problem Relation Age of Onset  . Cancer Mother   . Heart disease Father   . Heart disease Brother     SOCHx:   reports that he quit smoking about 46 years ago. His smoking use included Cigarettes. He has a 7.5 pack-year smoking history. He has never used smokeless tobacco. He reports that he does not drink alcohol or use illicit drugs.  ALLERGIES:  No Known Allergies  ROS: A comprehensive review of systems was negative except for: Constitutional: positive for fatigue Musculoskeletal: positive for arthralgias  HOME MEDS: [facility] Outpatient Prescriptions  Medication Sig Dispense Refill  . acetaminophen (TYLENOL) 500 MG tablet Take 500 mg by mouth at bedtime as needed (pain).     Marland Kitchen aspirin 81 MG tablet Take 81 mg by mouth daily.    . Carboxymethylcellul-Glycerin (OPTIVE) 0.5-0.9 % SOLN Place 1 drop into both eyes daily as needed (dry eyes).    Marland Kitchen losartan-hydrochlorothiazide (HYZAAR) 100-25 MG per tablet Take 1 tablet by mouth daily.     Marland Kitchen  metoprolol succinate (TOPROL-XL) 25 MG 24 hr tablet Take 25 mg by mouth daily.     . Multiple Vitamins-Minerals (PRESERVISION/LUTEIN PO) Take 1 tablet by mouth daily.     . naproxen (NAPROSYN) 500 MG tablet Take 500 mg by mouth 2 (two) times daily with a meal.    . Probiotic Product (ALIGN) 4 MG CAPS Take 4 mg by mouth daily.     . traMADol (ULTRAM) 50 MG tablet Take 50 mg by mouth at bedtime.     . verapamil (COVERA HS) 240 MG (CO) 24 hr tablet Take 240 mg by mouth at bedtime.      No [facility] facility-administered medications for this visit.    LABS/IMAGING: No results found for this or any previous visit (from the past 48 hour(s)). No results found.  VITALS: BP 138/66 mmHg  Pulse 66  Ht 5' 4.5" (1.638 m)  Wt 148 lb 8 oz (67.359 kg)  BMI 25.11 kg/m2  EXAM: General appearance: alert and no distress Neck: no carotid bruit and no JVD Lungs: clear to auscultation bilaterally Heart: regular rate and rhythm, S1, S2 normal, no murmur, click, rub or gallop Abdomen: soft, non-tender; bowel sounds normal; no masses,  no organomegaly Extremities: extremities normal, atraumatic, no cyanosis or edema Pulses: 2+ and symmetric Skin: Skin color, texture, turgor normal. No rashes or lesions Neurologic: Grossly normal Psych: Normal  EKG: Sinus rhythm at 66 with bigeminal PVCs, incomplete right bundle branch block  ASSESSMENT: 1. Ischemic cardiomyopathy, EF 45-50%, inferoapical scar 2. 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 Fatigue  PLAN: 1.   James Calhoun is now reporting fatigue. In retrospect he's chalk this up to being old however it may be related to low heart rate. His PVCs are nonconducted and have not been able to be suppressed with beta blocker. He may need antiarrhythmics therapy. I like to refer him to Dr. Lewayne Bunting for evaluation of possible anti-rhythmic therapy if he feels that he is symptomatic and may benefit from suppression of his PVCs. It's also unclear whether these PVCs be related to prior inferior scar.   Chrystie Nose, MD, [facility] Attending Cardiologist [facility]  Chrystie Nose 04/02/2015, 1:19 PM

## 2015-04-02 NOTE — Patient Instructions (Signed)
You have been referred to Dr. Ladona Ridgel  Your physician wants you to follow-up in: 6 months with Dr. Rennis Golden. You will receive a reminder letter in the mail two months in advance. If you don't receive a letter, please call our office to schedule the follow-up appointment.

## 2015-04-03 ENCOUNTER — Telehealth: Payer: Self-pay | Admitting: Internal Medicine

## 2015-04-03 NOTE — Telephone Encounter (Signed)
Spoke with Mrs. James Calhoun regarding appointment for her husband on 04/11/15 @ 11:45 am--arrive at 11:30 am--for appointment with Dr. Ladona Ridgel.  Address and phone # given to Mrs.  She voiced her understanding.

## 2015-04-11 ENCOUNTER — Institutional Professional Consult (permissible substitution): Payer: Medicare Other | Admitting: Internal Medicine

## 2015-08-28 ENCOUNTER — Encounter: Payer: Self-pay | Admitting: Internal Medicine

## 2015-09-30 ENCOUNTER — Ambulatory Visit: Payer: Medicare Other | Admitting: Internal Medicine

## 2015-10-03 ENCOUNTER — Ambulatory Visit: Payer: Medicare Other | Admitting: Internal Medicine

## 2015-10-27 IMAGING — US US ABDOMEN COMPLETE
1 series · 13 of 25 positions shown · non-contrast
Comparison: CT abdomen pelvis of 04/11/2004

CLINICAL DATA: Right flank pain, nausea, history of kidney stones

EXAM:
ULTRASOUND ABDOMEN COMPLETE

[Series 1: us abdomen complete · 0.45mm/px · 13 of 99 slices shown]
[im 1/99]
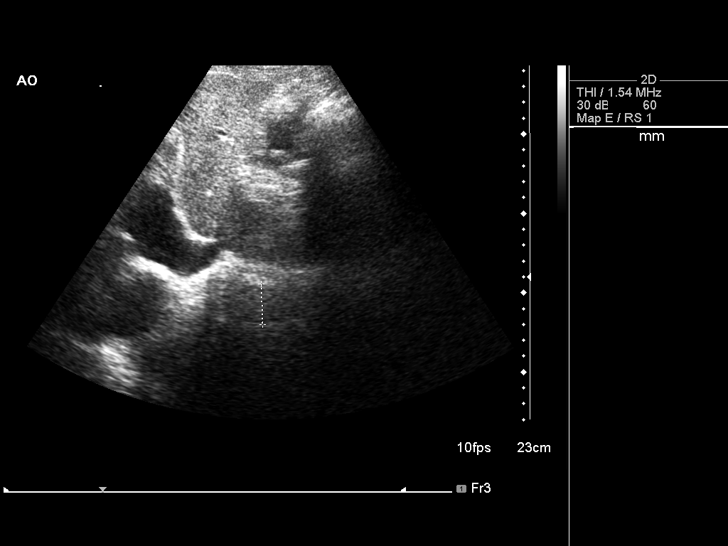
[im 9/99]
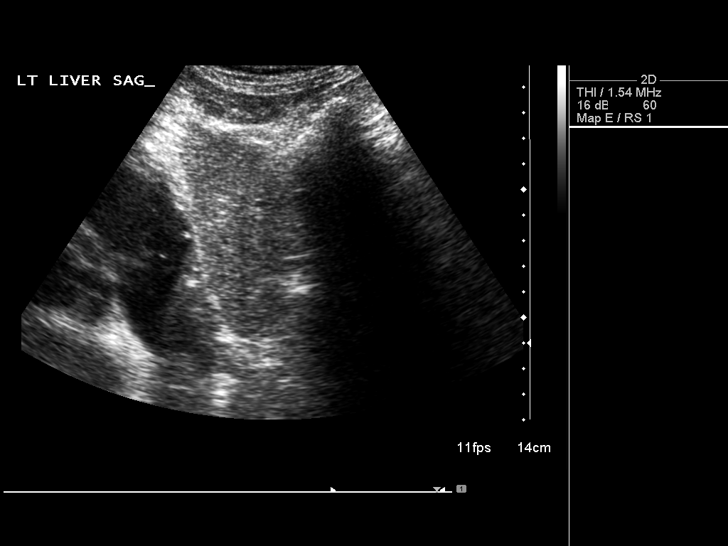
[im 17/99]
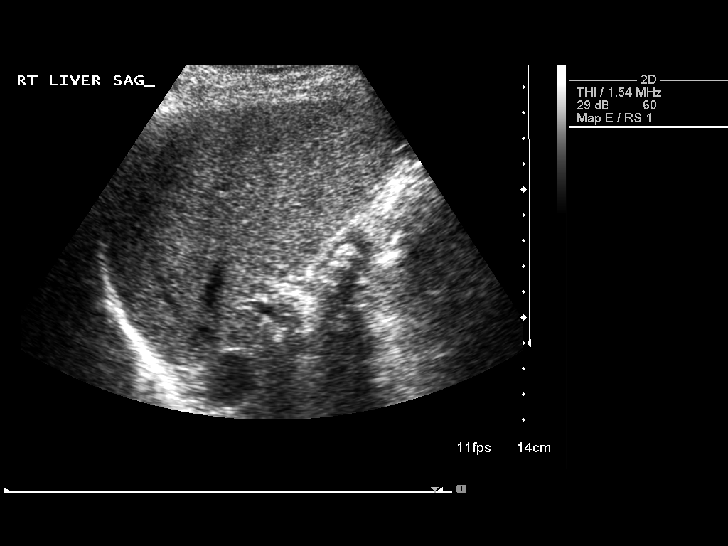
[im 25/99]
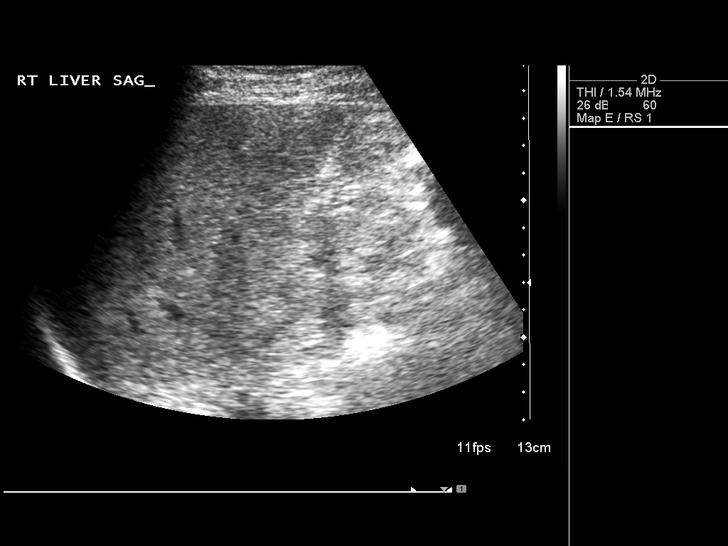
[im 33/99]
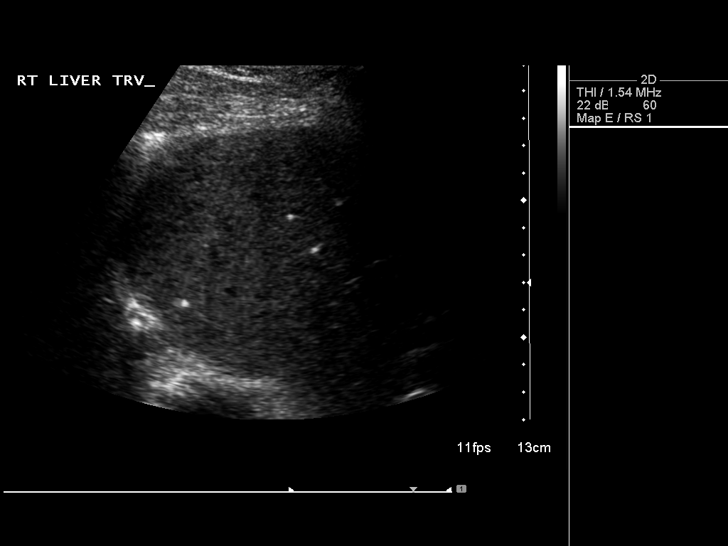
[im 41/99]
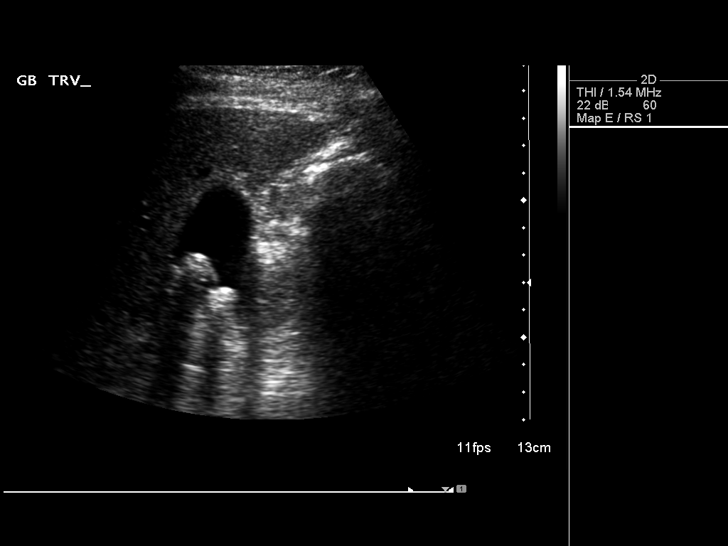
[im 50/99]
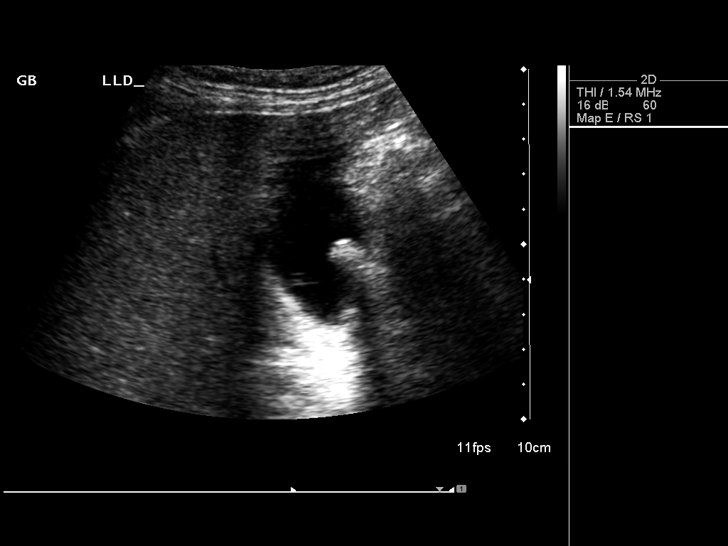
[im 58/99]
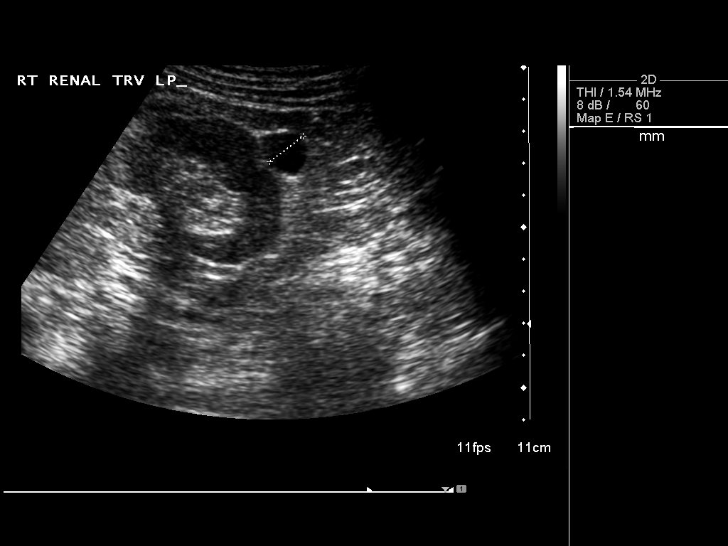
[im 66/99]
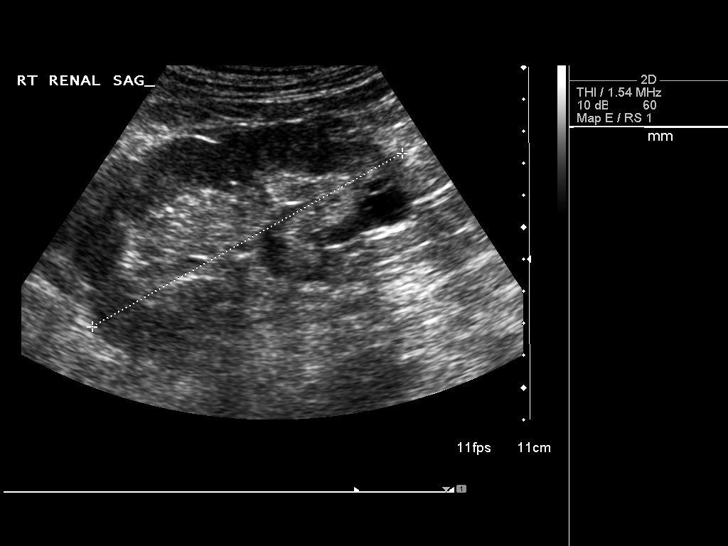
[im 74/99]
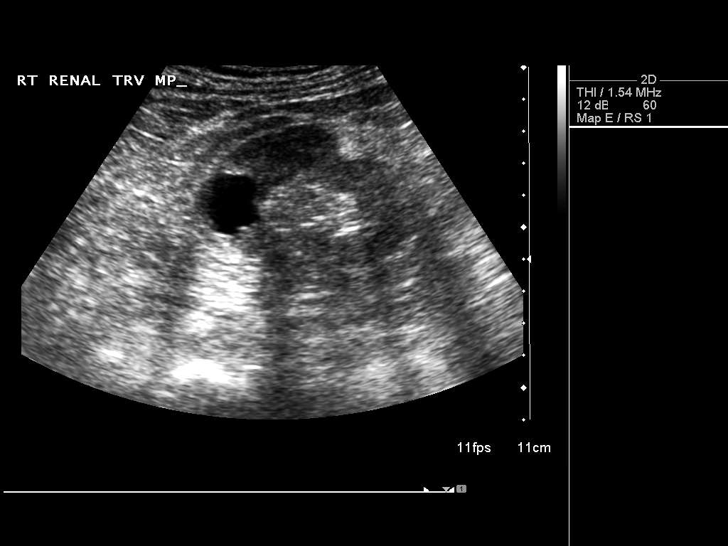
[im 82/99]
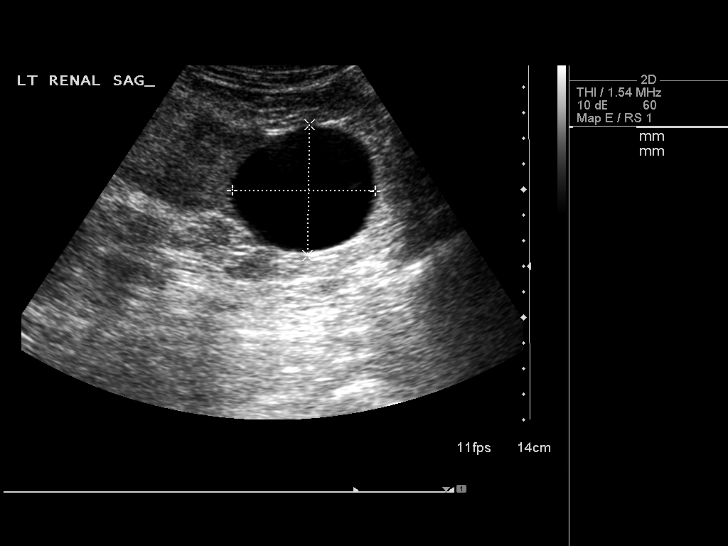
[im 90/99]
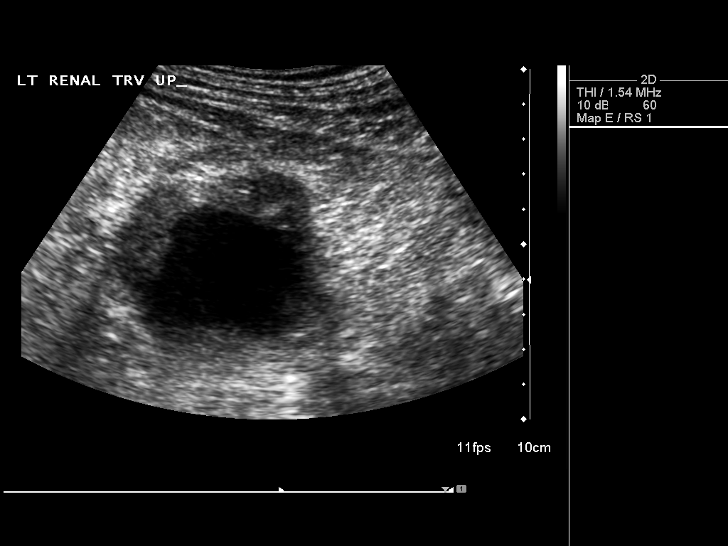
[im 99/99]
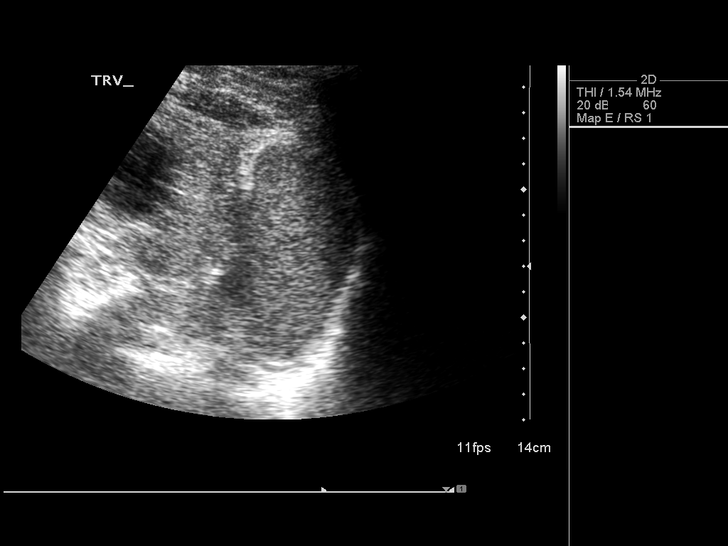

[13 of 25 positions shown; findings below may reference images not displayed]

FINDINGS: Gallbladder:

The gallbladder is visualized and several multiple gallstones are
present, the largest gallstone measuring 1.5 cm in diameter. There
is no pain over the gallbladder with compression.

Common bile duct:

Diameter: The common bile duct is normal measuring 5.3 mm in
diameter.

Liver:

The liver has a normal echogenic pattern. No focal abnormality is
seen.

IVC:

No abnormality visualized.

Pancreas:

The pancreas is largely obscured by bowel gas.

Spleen:

The spleen is normal measuring 5.5 cm sagittally.

Right Kidney:

Length: 11.1 cm.. Multiple small cysts are present, the largest of
2.1 cm in diameter.

Left Kidney:

Length: 11.0 cm.. Multiple small cysts are noted. The largest cyst
measures 7.4 cm in diameter.

Abdominal aorta:

The abdominal aorta is ectatic with moderate atheromatous change
present diffusely.

Other findings:
IMPRESSION: 1. Multiple small gallstones. No present evidence of acute
cholecystitis.
2. No hydronephrosis.  Multiple small renal cysts.
3. Ectatic abdominal aorta with moderate atheromatous change
present.

## 2015-10-30 ENCOUNTER — Ambulatory Visit: Payer: Medicare Other | Admitting: Internal Medicine

## 2016-08-05 IMAGING — MR MR LUMBAR SPINE W/O CM
5 series · 41 of 48 positions shown · non-contrast
Comparison: Lumbar spine radiographs 07/27/2005

CLINICAL DATA: Low back pain and right leg pain for 3 years.

EXAM:
MRI LUMBAR SPINE WITHOUT CONTRAST
TECHNIQUE: Multiplanar, multisequence MR imaging of the lumbar spine was
performed. No intravenous contrast was administered.

[Series 3: T2 · sagittal · 4.0mm · 0.88mm/px · 6 of 14 slices shown (1 of 2)]
[im 1/14]
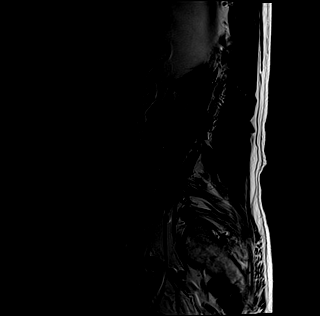
[im 3/14]
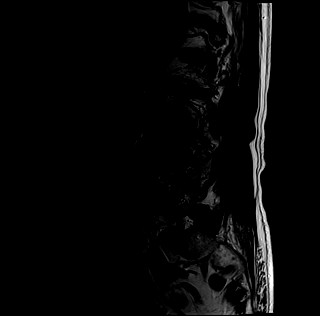
[im 6/14]
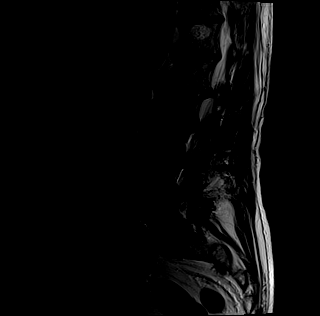
[im 8/14]
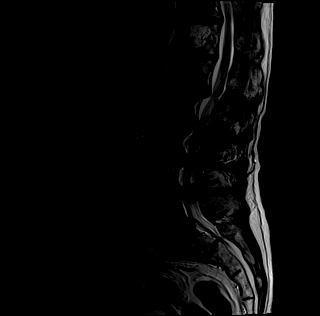
[im 11/14]
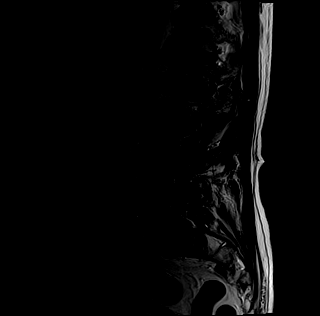
[im 14/14]
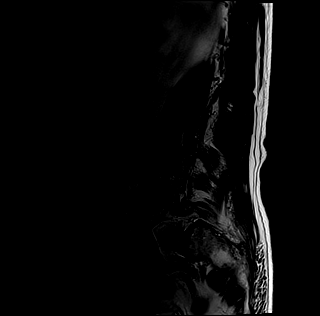

[Series 4: tirm sag · sagittal · 4.0mm · 0.55mm/px · 6 of 14 slices shown]
[im 1/14]
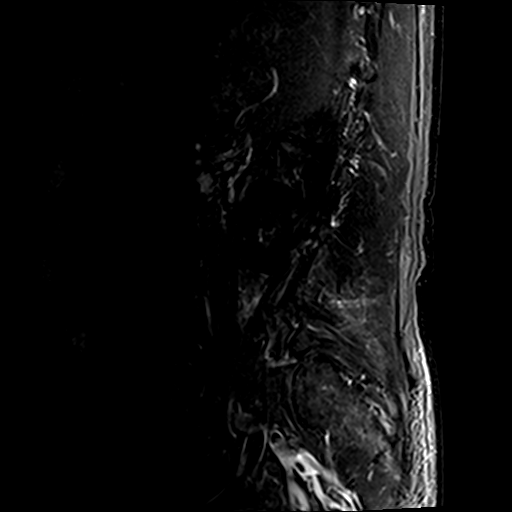
[im 3/14]
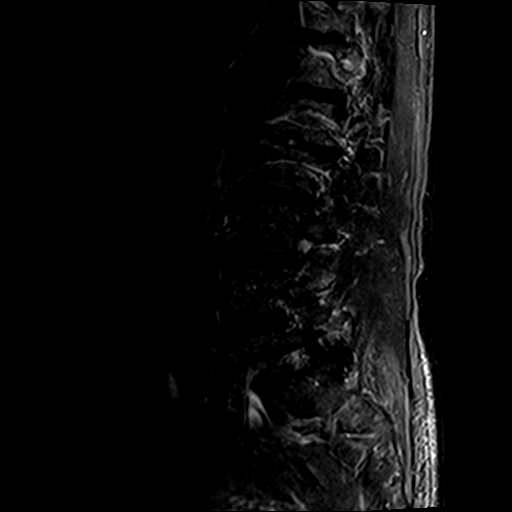
[im 6/14]
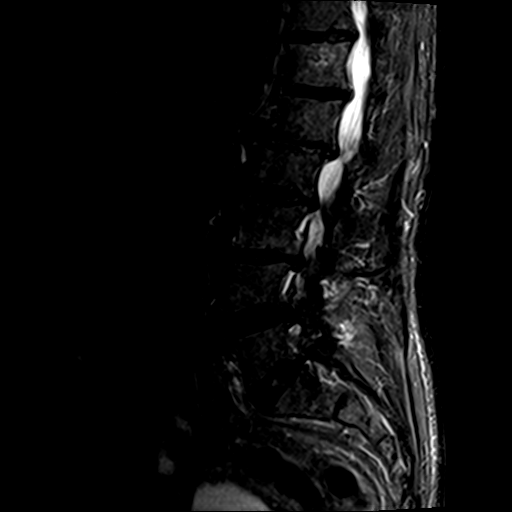
[im 8/14]
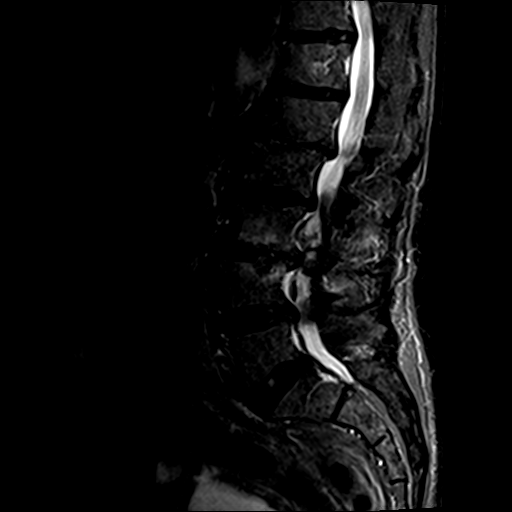
[im 11/14]
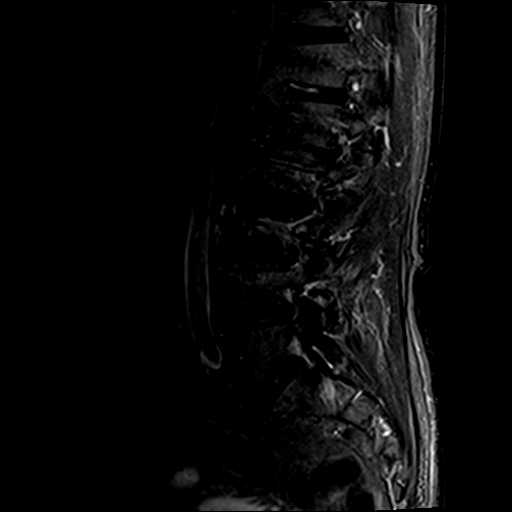
[im 14/14]
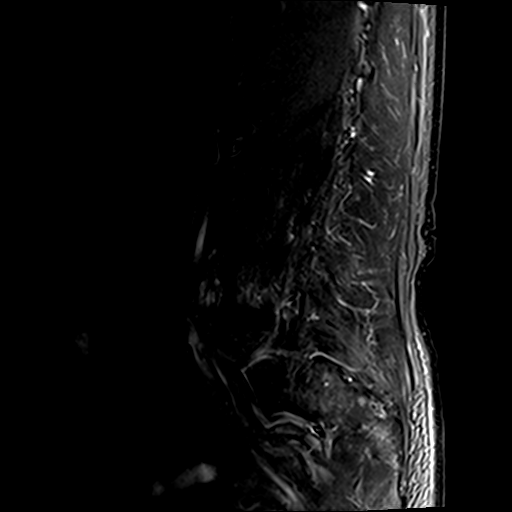

[Series 5: T1 · sagittal · 4.0mm · 0.88mm/px · 6 of 14 slices shown (1 of 2)]
[im 1/14]
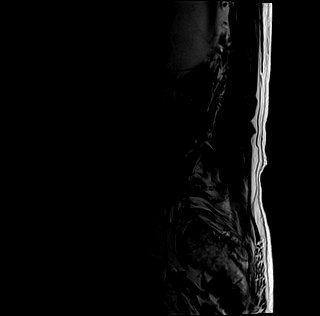
[im 3/14]
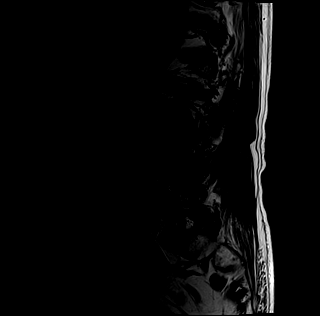
[im 6/14]
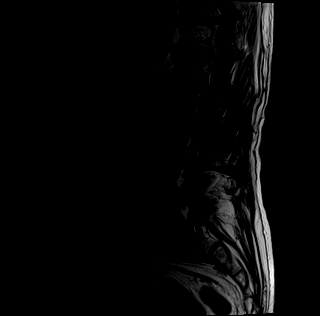
[im 8/14]
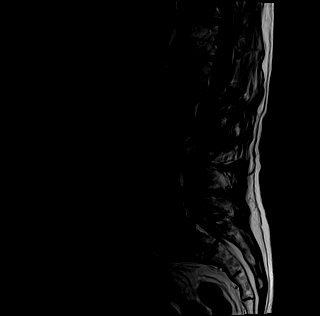
[im 11/14]
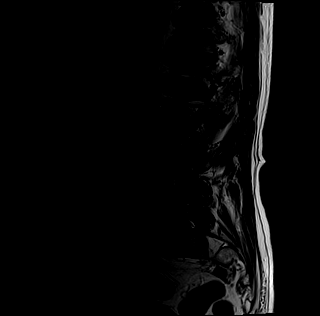
[im 14/14]
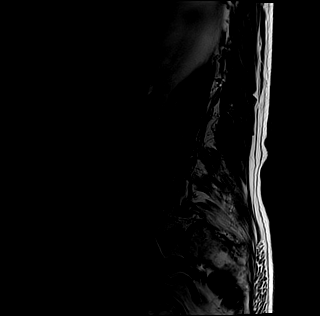

[Series 6: T1 · axial · 4.0mm · 0.70mm/px · z∈[-67,+137]mm · 9 of 38 slices shown (2 of 2)]
[im 1/38]
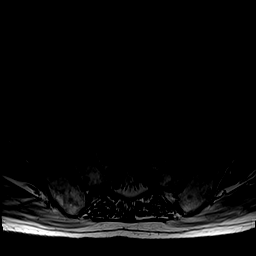
[im 6/38]
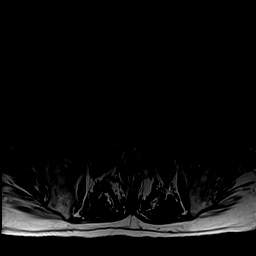
[im 11/38]
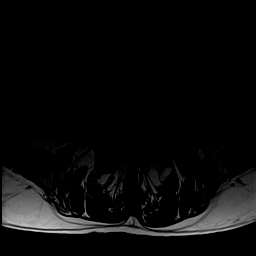
[im 16/38]
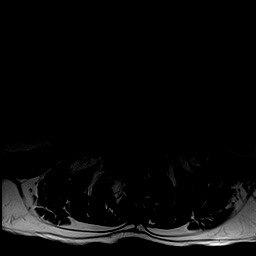
[im 19/38]
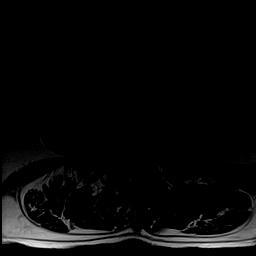
[im 22/38]
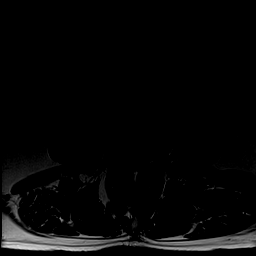
[im 27/38]
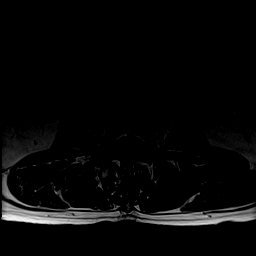
[im 32/38]
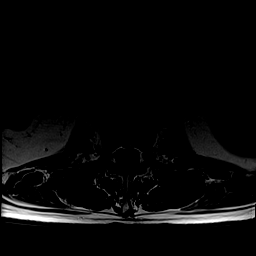
[im 38/38]
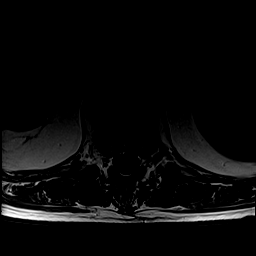

[Series 7: T2 · axial · 4.0mm · 0.70mm/px · z∈[-67,+137]mm · 14 of 38 slices shown (2 of 2)]
[im 1/38]
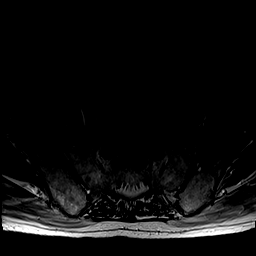
[im 3/38]
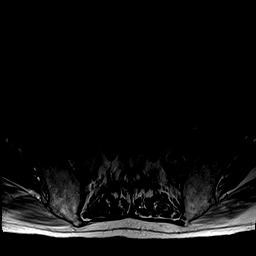
[im 6/38]
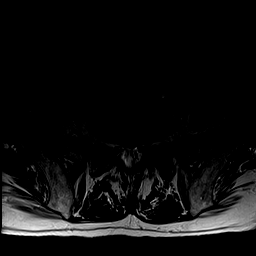
[im 8/38]
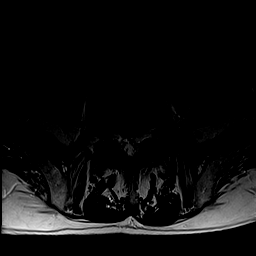
[im 11/38]
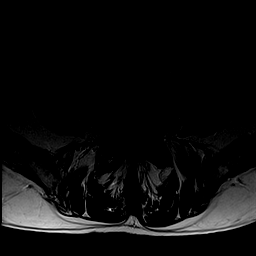
[im 14/38]
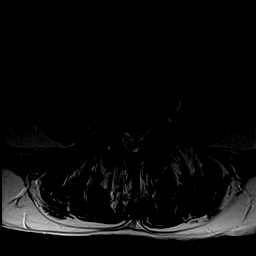
[im 16/38]
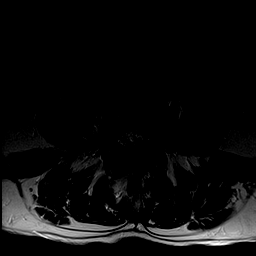
[im 19/38]
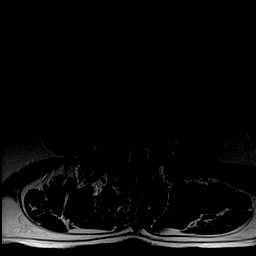
[im 22/38]
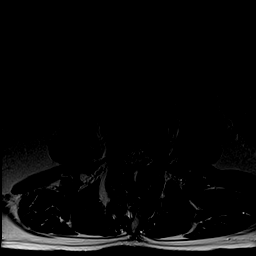
[im 24/38]
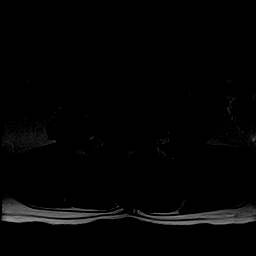
[im 27/38]
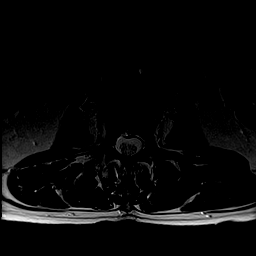
[im 30/38]
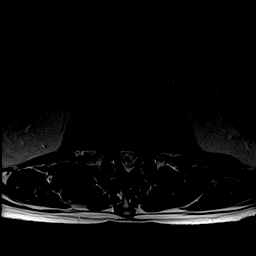
[im 32/38]
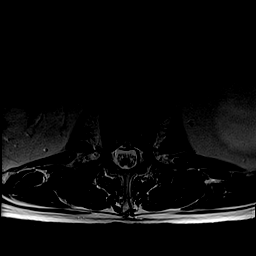
[im 38/38]
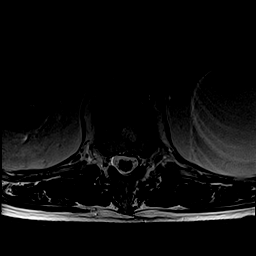

[41 of 48 positions shown; findings below may reference images not displayed]

FINDINGS: There is grade 1 retrolisthesis of L1 on L2 and L3 on L4, likely
degenerative. T12 vertebral body hemangioma is noted. There is
diffuse lumbar disc desiccation. Disc space narrowing is severe at
L1-2 and moderate to severe at L3-4. There are prominent
degenerative marrow changes at L3-4, including mild marrow edema.
Schmorl's node is noted at L3-4. There is no evidence of compression
fracture.

There is narrowing of the mid lumbar spinal canal on a congenital
basis due to short pedicles. Conus medullaris is normal in signal
and terminates at L1-2. T2 hyperintense lesions are partially
visualized involving both kidneys, including a left renal lesion
that measures at least 7.7 cm in size, likely cysts but incompletely
evaluated.

T11-12: Only imaged sagittally.  Mild disc bulging without stenosis.

T12-L1:  Small left central disc protrusion without stenosis.

L1-2: Listhesis with disc uncovering, endplate spurring, mild facet
hypertrophy result in mild spinal stenosis, mild right and moderate
left lateral recess stenosis, and moderate to severe bilateral
neural foraminal stenosis.

L2-3: Mild circumferential disc bulging and moderate facet and
ligamentum flavum hypertrophy result in moderate spinal stenosis,
moderate left greater than right lateral recess stenosis, and
moderate right greater than left neural foraminal stenosis.

L3-4: Listhesis with disc uncovering, endplate spurring, and severe
facet and ligamentum flavum hypertrophy result in severe spinal
stenosis and severe right and moderate left neural foraminal
stenosis. A 6 mm synovial cyst is suspected at the medial aspect of
the right facet joint, contributing to spinal canal and right
lateral recess stenosis.

L4-5: Prominent circumferential disc bulging and severe facet and
ligamentum flavum hypertrophy result in severe spinal stenosis
(residual canal 5 mm in diameter) and moderate right and severe left
neural foraminal stenosis.

L5-S1: Mild disc bulging with small central annular fissure and
moderate right and severe left facet hypertrophy result in mild left
lateral recess and mild bilateral neural foraminal stenosis without
significant spinal stenosis.
IMPRESSION: Severe multilevel disc and facet degeneration in the lumbar spine
resulting in severe spinal stenosis at L3-4 and L4-5. Severe right
neural foraminal stenosis at L3-4.

## 2018-05-04 ENCOUNTER — Telehealth: Payer: Self-pay

## 2018-05-04 NOTE — Telephone Encounter (Signed)
Phone call placed to patient. Received message asking for remote access code

## 2018-05-04 NOTE — Telephone Encounter (Signed)
Unable to locate alternative number

## 2018-05-13 ENCOUNTER — Telehealth: Payer: Self-pay

## 2018-05-13 NOTE — Telephone Encounter (Signed)
VM left to offer to schedule visit with Palliative Care 

## 2018-05-25 ENCOUNTER — Telehealth: Payer: Self-pay

## 2018-05-25 NOTE — Telephone Encounter (Signed)
Phone call placed to patient's son Jillyn HiddenGary to offer to schedule visit with Palliative Care. Jillyn HiddenGary shared that patient's wife passed away last year. Son has requested help with resources to care for patient. Visit scheduled for 05/26/18

## 2018-05-26 ENCOUNTER — Other Ambulatory Visit: Payer: Medicare Other | Admitting: Internal Medicine

## 2018-05-26 ENCOUNTER — Encounter: Payer: Self-pay | Admitting: Internal Medicine

## 2018-05-26 DIAGNOSIS — R52 Pain, unspecified: Secondary | ICD-10-CM

## 2018-05-26 DIAGNOSIS — K4090 Unilateral inguinal hernia, without obstruction or gangrene, not specified as recurrent: Secondary | ICD-10-CM

## 2018-05-26 DIAGNOSIS — R2681 Unsteadiness on feet: Secondary | ICD-10-CM

## 2018-05-26 DIAGNOSIS — H109 Unspecified conjunctivitis: Secondary | ICD-10-CM

## 2018-05-26 DIAGNOSIS — Z515 Encounter for palliative care: Secondary | ICD-10-CM

## 2018-05-26 NOTE — Progress Notes (Signed)
PALLIATIVE CARE CONSULT VISIT   PATIENT NAME: James Calhoun DOB: [birth year] MRN: [id number]  PRIMARY CARE PROVIDER:   Jarome MatinPaterson, Daniel, MD  REFERRING PROVIDER:  Jarome MatinPaterson, Daniel, MD [address] [place], [place] [address]  RESPONSIBLE PARTY:  Avon GullyHCPOA (son) Fransisca ConnorsGary Balthazor ([phone]) [phone], (h) [phone], D-I-L Vance GatherShelley (Gary's wife) ([phone]) [phone]  IMPRSSIONS/RECOMMENDATIONS:  1. Bacterial conjunctivitis; bilateral lower conjunctival redness / lower conjunctival sac drooping /  yellowish drainage. A. Trimethoprim-polymyxin B 0.1%-10,000 units/mL ophthalmic drops 1 to 2 drops 4 times daily for 5 to 7 days called into  [facility] [phone]   2. Right inguinal hernia, progressed over last 2 month to size of plum. Soft, non-reducible. Patient and family not interested in further evaluation or treatment.  3. Gait instability: Over this last year need for consistent use of walker and cane. Slow shuffling gait. Able to transfer and ambulate independently. He does not leave the home, not even to go on the side porch, for fear of falling (none so far).   4. Care Management: Patient lives by himself since death of his wife last year, with frequent checks on him by his son and D-I-L Vance GatherShelley. I did a quick home walk through with Vance GatherShelley; we discussed removing various floor mats in kitchen and bathroom, and installation of grip bar in bathroom. Patient has a small step down from kitchen to living room; Shelly plans to ask son Jillyn HiddenGary to consider installing a ramp. There's a little dog from next door [employer](Sadie) who comes by every day and provides patient companionship. -Shower chair in place, but patient doesn't take showers b/c of fear of falling. Vance GatherShelley is looking into hiring an aide who can come by and assist patient with a good wash down, at least once a week. We discussed foot soaks for those dry feet, with a moisturizing cream. -Hospital bed in [place]; patient is using and states it's comfortable. We  discussed use of bedside commode (sits to urinate) for nocturnal urination (states he goes to the BR about q2-3hrs) to decreased fall risk. [facility]; would prefer to maintain his functional ability as long as possible. He used pad inserts for long term stress and frequency urinary incontinence. -I left a message with Burnett HarryShelly to consider putting into place a life alert system.  5. Pain: Muscular / Skeletal: left hip, across lower back, and occasional R groin discomfort managed with Tramadol bid to tid.  6. Goals of Care: Patient's son Jillyn HiddenGary is DPOA. Patient has a living will.  DNR in place in the home. I did fill out another copy of the DNR; advised them to keep one copy on the fridge, and second copy to accompany patient should he take trips outside the home. MOST form discussed with patient and daughter-in-law Vance GatherShelley; completed and left in home. DNR/DNI. Limit scope of medical interventions to comfort care. Use of IV antibiotics and IVFs to be determined at the time of need. No tube feedings. Written information reviewed and left in the home, as well.   7. Follow up: Family will call our office to schedule to be seen in f/u by NP in 1-2 months, and/or prn.  I spent 105 minutes providing this consultation,  from 9:30am to 11:15am. More than 50% of the time in this consultation was spent coordinating communication.   HISTORY OF PRESENT ILLNESS:  James Calhoun is a 84 y.o. year old male with  h/o arthritis and HTN. He has had some functional decline;  increased short term memory deficits and gait instability. Family had requested an evaluation for [facility], but he was deemed ineligible due to expected prognosis of greater than 6 months.  Palliative Care was asked to help assist with home management of symptoms, and further address goals of care.   CODE STATUS: DNR; MOST form completed today  PPS: 60% HOSPICE ELIGIBILITY/DIAGNOSIS: Not at this time  PAST MEDICAL HISTORY:   Past Medical History:  Diagnosis Date  . Arthritis   . ED (erectile dysfunction)   . GERD (gastroesophageal reflux disease)   . Hip pain   . Hypertension   . Low back pain   . Osteoporosis   . Thin skin    bruises easy    SOCIAL HX:  Social History   Tobacco Use  . Smoking status: Former Smoker    Packs/day: 0.25    Years: 30.00    Pack years: 7.50    Types: Cigarettes    Last attempt to quit: 10/19/1968    Years since quitting: 49.6  . Smokeless tobacco: Never Used  Substance Use Topics  . Alcohol use: No    ALLERGIES: No Known Allergies   PERTINENT MEDICATIONS:  Outpatient Encounter Medications as of 05/26/2018  Medication Sig  . acetaminophen (TYLENOL) 500 MG tablet Take 500 mg by mouth at bedtime as needed (pain).   Marland Kitchen aspirin 81 MG tablet Take 81 mg by mouth daily.  . Carboxymethylcellul-Glycerin (OPTIVE) 0.5-0.9 % SOLN Place 1 drop into both eyes daily as needed (dry eyes).  Marland Kitchen losartan-hydrochlorothiazide (HYZAAR) 100-25 MG per tablet Take 1 tablet by mouth daily.   . Multiple Vitamins-Minerals (PRESERVISION/LUTEIN PO) Take 1 tablet by mouth daily.   . Probiotic Product (ALIGN) 4 MG CAPS Take 4 mg by mouth daily.   . traMADol (ULTRAM) 50 MG tablet Take 50 mg by mouth at bedtime.   . verapamil (COVERA HS) 240 MG (CO) 24 hr tablet Take 240 mg by mouth at bedtime.  . metoprolol succinate (TOPROL-XL) 25 MG 24 hr tablet Take 25 mg by mouth daily.   . naproxen (NAPROSYN) 500 MG tablet Take 500 mg by mouth 2 (two) times daily with a meal.   No facility-administered encounter medications on file as of 05/26/2018.     PHYSICAL EXAM:   General: NAD, frail appearing, thin Cardiovascular: regular rate and rhythm, without MRB Pulmonary: bilateral CTA Abdomen: soft, nontender, + bowel sounds. Right groin plum size soft, non-reduciable inguinal hernia. Extremities: gouty arthritic changes bilateral elbows L > R, and on knuckles.bilateral LE swelling softly pitting to high calf.  Dry scaly skin knees to toes. Feet and LE changes of venous stasis.  Skin: no rashes Neurological: Weakness but otherwise nonfocal  Anselm Lis, NP

## 2018-08-17 ENCOUNTER — Other Ambulatory Visit: Payer: Self-pay | Admitting: General Surgery

## 2018-09-20 NOTE — Patient Instructions (Addendum)
James Calhoun  09/20/2018   Your procedure is scheduled on: 09-26-18   [facility] Main  Entr[facility]    Report to admitting at 5:30AM    Call this number if you have problems the morning of surgery [phone]     Remember: NO SOLID FOOD AFTER MIDNIGHT THE NIGHT PRIOR TO SURGERY. NOTHING BY MOUTH EXCEPT CLEAR LIQUIDS UNTIL 3 HOURS PRIOR TO SCHEULED SURGERY. PLEASE FINISH ENSURE DRINK PER SURGEON ORDER  WHICH NEEDS TO BE COMPLETED AT ____4:30AM_____.  BRUSH YOUR TEETH MORNING OF SURGERY AND RINSE YOUR MOUTH OUT, NO CHEWING GUM CANDY OR MINTS.       CLEAR LIQUID DIET   Foods Allowed                                                                     Foods Excluded  Coffee and tea, regular and decaf                             liquids that you cannot  Plain Jell-O in any flavor                                             see through such as: Fruit ices (not with fruit pulp)                                     milk, soups, orange juice  Iced Popsicles                                    All solid food Carbonated beverages, regular and diet                                    Cranberry, grape and apple juices Sports drinks like Gatorade Lightly seasoned clear broth or consume(fat free) Sugar, honey syrup  Sample Menu Breakfast                                Lunch                                     Supper Cranberry juice                    Beef broth                            Chicken broth Jell-O  Grape juice                           Apple juice Coffee or tea                        Jell-O                                      Popsicle                                                Coffee or tea                        Coffee or tea  _____________________________________________________________________       Take these medicines the morning of surgery with A SIP OF WATER: metoprolol, verapamil                                  You may not have any metal on your body including hair pins and              piercings  Do not wear jewelry, make-up, lotions, powders or perfumes, deodorant                   Men may shave face and neck.   Do not bring valuables to the hospital. [facility] IS NOT             RESPONSIBLE   FOR VALUABLES.  Contacts, dentures or bridgework may not be worn into surgery.  Leave suitcase in the car. After surgery it may be brought to your room.                 Please read over the following fact sheets you were given: _____________________________________________________________________             [facility] - Preparing for Surgery Before surgery, you can play an important role.  Because skin is not sterile, your skin needs to be as free of germs as possible.  You can reduce the number of germs on your skin by washing with CHG (chlorahexidine gluconate) soap before surgery.  CHG is an antiseptic cleaner which kills germs and bonds with the skin to continue killing germs even after washing. Please DO NOT use if you have an allergy to CHG or antibacterial soaps.  If your skin becomes reddened/irritated stop using the CHG and inform your nurse when you arrive at Short Stay. Do not shave (including legs and underarms) for at least 48 hours prior to the first CHG shower.  You may shave your face/neck. Please follow these instructions carefully:  1.  Shower with CHG Soap the night before surgery and the  morning of Surgery.  2.  If you choose to wash your hair, wash your hair first as usual with your  normal  shampoo.  3.  After you shampoo, rinse your hair and body thoroughly to remove the  shampoo.  4.  Use CHG as you would any other liquid soap.  You can apply chg directly  to the skin and wash                       Gently with a scrungie or clean washcloth.  5.  Apply the CHG Soap to your body ONLY FROM THE NECK DOWN.   Do not use on face/ open                            Wound or open sores. Avoid contact with eyes, ears mouth and genitals (private parts).                       Wash face,  Genitals (private parts) with your normal soap.             6.  Wash thoroughly, paying special attention to the area where your surgery  will be performed.  7.  Thoroughly rinse your body with warm water from the neck down.  8.  DO NOT shower/wash with your normal soap after using and rinsing off  the CHG Soap.                9.  Pat yourself dry with a clean towel.            10.  Wear clean pajamas.            11.  Place clean sheets on your bed the night of your first shower and do not  sleep with pets. Day of Surgery : Do not apply any lotions/deodorants the morning of surgery.  Please wear clean clothes to the hospital/surgery center.  FAILURE TO FOLLOW THESE INSTRUCTIONS MAY RESULT IN THE CANCELLATION OF YOUR SURGERY PATIENT SIGNATURE_________________________________  NURSE SIGNATURE__________________________________  ________________________________________________________________________

## 2018-09-22 ENCOUNTER — Encounter (HOSPITAL_COMMUNITY): Payer: Self-pay | Admitting: Anesthesiology

## 2018-09-22 ENCOUNTER — Telehealth: Payer: Self-pay

## 2018-09-22 ENCOUNTER — Encounter (HOSPITAL_COMMUNITY): Payer: Self-pay

## 2018-09-22 ENCOUNTER — Encounter (HOSPITAL_COMMUNITY)
Admission: RE | Admit: 2018-09-22 | Discharge: 2018-09-22 | Disposition: A | Discharge status: Home / Self Care | Payer: Medicare Other | Source: Ambulatory Visit | Attending: General Surgery | Admitting: General Surgery

## 2018-09-22 ENCOUNTER — Other Ambulatory Visit: Payer: Self-pay

## 2018-09-22 DIAGNOSIS — I4519 Other right bundle-branch block: Secondary | ICD-10-CM | POA: Insufficient documentation

## 2018-09-22 DIAGNOSIS — Z01818 Encounter for other preprocedural examination: Secondary | ICD-10-CM | POA: Diagnosis not present

## 2018-09-22 HISTORY — DX: Other specified cardiac arrhythmias: I49.8

## 2018-09-22 HISTORY — DX: Cardiac arrhythmia, unspecified: I49.9

## 2018-09-22 HISTORY — DX: Acute myocardial infarction, unspecified: I21.9

## 2018-09-22 HISTORY — DX: Unspecified hearing loss, unspecified ear: H91.90

## 2018-09-22 HISTORY — DX: Personal history of other diseases of the circulatory system: Z86.79

## 2018-09-22 LAB — COMPREHENSIVE METABOLIC PANEL
ALT: 17 U/L (ref 0–44)
ANION GAP: 8 (ref 5–15)
AST: 19 U/L (ref 15–41)
Albumin: 3.6 g/dL (ref 3.5–5.0)
Alkaline Phosphatase: 63 U/L (ref 38–126)
BUN: 16 mg/dL (ref 8–23)
CO2: 29 mmol/L (ref 22–32)
Calcium: 9 mg/dL (ref 8.9–10.3)
Chloride: 105 mmol/L (ref 98–111)
Creatinine, Ser: 1.15 mg/dL (ref 0.61–1.24)
GFR calc Af Amer: >60 mL/min (ref >60)
GFR calc non Af Amer: 57 mL/min — ABNORMAL LOW (ref >60)
Glucose, Bld: 94 mg/dL (ref 70–99)
Potassium: 4.5 mmol/L (ref 3.5–5.1)
Sodium: 142 mmol/L (ref 135–145)
Total Bilirubin: 0.7 mg/dL (ref 0.3–1.2)
Total Protein: 6.7 g/dL (ref 6.5–8.1)

## 2018-09-22 LAB — CBC WITH DIFFERENTIAL/PLATELET
Abs Immature Granulocytes: 0.06 10*3/uL (ref 0.00–0.07)
Basophils Absolute: 0 10*3/uL (ref 0.0–0.1)
Basophils Relative: 0 %
EOS ABS: 0.1 10*3/uL (ref 0.0–0.5)
EOS PCT: 1 %
HCT: 40.4 % (ref 39.0–52.0)
Hemoglobin: 12.7 g/dL — ABNORMAL LOW (ref 13.0–17.0)
Immature Granulocytes: 1 %
Lymphocytes Relative: 12 %
Lymphs Abs: 1.3 10*3/uL (ref 0.7–4.0)
MCH: 31.8 pg (ref 26.0–34.0)
MCHC: 31.4 g/dL (ref 30.0–36.0)
MCV: 101.3 fL — ABNORMAL HIGH (ref 80.0–100.0)
Monocytes Absolute: 1.2 10*3/uL — ABNORMAL HIGH (ref 0.1–1.0)
Monocytes Relative: 11 %
Neutro Abs: 8.5 10*3/uL — ABNORMAL HIGH (ref 1.7–7.7)
Neutrophils Relative %: 75 %
PLATELETS: 252 10*3/uL (ref 150–400)
RBC: 3.99 MIL/uL — ABNORMAL LOW (ref 4.22–5.81)
RDW: 13 % (ref 11.5–15.5)
WBC: 11.2 10*3/uL — AB (ref 4.0–10.5)
nRBC: 0 % (ref 0.0–0.2)

## 2018-09-22 NOTE — Telephone Encounter (Signed)
   [facility] Pre-operative Risk Assessment    Request for surgical clearance:  1. What type of surgery is being performed? Hernia Repair   2. When is this surgery scheduled?  09/26/18   3. What type of clearance is required (medical clearance vs. Pharmacy clearance to hold med vs. Both)? Both  4. Are there any medications that need to be held prior to surgery and how long? ASA   5. Practice name and name of physician performing surgery?  [facility], Dr. Dalbert Batman   6. What is your office phone number [phone]    7.   What is your office fax number  [phone] Attn: Marguarite Arbour, RMA  8.   Anesthesia type (None, local, MAC, general) ? General Anesthesia   Michaelyn Barter 09/22/2018, 3:21 PM  _________________________________________________________________   "The patient was diagnosed by Dr. Debara Pickett with an Arrhythmia and is advising a possible pacemaker."

## 2018-09-22 NOTE — Telephone Encounter (Signed)
Left detailed message for pt that he would need an appt before we can address his surgical clearance.  Pt not seen since 03/2015.

## 2018-09-22 NOTE — Telephone Encounter (Signed)
   Primary Cardiologist:No primary care provider on file. Previously dr. Rennis GoldenHilty 03/2015  Chart reviewed as part of pre-operative protocol coverage. Because of Gilbert Spray's past medical history and time since last visit, he/she will require a follow-up visit in order to better assess preoperative cardiovascular risk.  Last OV 2016  Pre-op covering staff: - Please schedule appointment and call patient to inform them. - Please contact requesting surgeon's office via preferred method (i.e, phone, fax) to inform them of need for appointment prior to surgery.   Laurann Montanaayna N Meggie Laseter, PA-C  09/22/2018, 4:43 PM

## 2018-09-22 NOTE — Progress Notes (Signed)
At pre-op appt today .RN spoke with anesthesia Dr Bradley FerrisEllender and had him review patient last ECHO , EKG and cardiology visit [all in epic] from 2016. After review, Dr Bradley FerrisEllender requests that new EKG be done at pre-op today [comeleted, and reviewed by Dr Bradley FerrisEllender] and requests also that patient f/u with the referral from cardiology noted in the progress notes of Dr Rennis GoldenHilty dated 04-02-15 in epic prior to proceeding with surgery since patient has not seen cardiology since then and reports he never went for the f/u.    RN called and spoke with [facility]  triage nurse to make aware. Triage nurse to f/u with patient surgeon to make aware.

## 2018-09-23 NOTE — Telephone Encounter (Signed)
2nd attempt: Lvm for pt to call back to schedule appointment for surgical clearance.

## 2018-09-26 ENCOUNTER — Ambulatory Visit (HOSPITAL_COMMUNITY): Admission: RE | Admit: 2018-09-26 | Payer: Medicare Other | Source: Ambulatory Visit | Admitting: General Surgery

## 2018-09-26 ENCOUNTER — Encounter (HOSPITAL_COMMUNITY): Admission: RE | Payer: Self-pay | Source: Ambulatory Visit

## 2018-09-26 SURGERY — REPAIR, HERNIA, INGUINAL, ADULT
Anesthesia: General | Laterality: Right

## 2018-09-26 NOTE — Telephone Encounter (Signed)
3rd attempt: LVM  For pt to call our office and schedule appointment for clearance, Pt's surgery was scheduled for today 09/26/18. I have reached out to the requesting office and they state that the surgery has been canceled and once pt is seen can cleared by cardiology they will reschedule surgery.

## 2018-09-27 NOTE — Telephone Encounter (Signed)
Follow Up:     [name] from Dr Jacinto HalimIngram's office called. She said please contact pt's son, James Calhoun at [phone]. Pt is unable to hear good, son handle everything for him.

## 2018-09-27 NOTE — Telephone Encounter (Signed)
Pt is scheduled to see Dr. Rennis GoldenHilty on 09/30/18. Clearance will be addressed at visit. I have notified requesting office of appointment.

## 2018-09-30 ENCOUNTER — Encounter: Payer: Self-pay | Admitting: Internal Medicine

## 2018-09-30 ENCOUNTER — Ambulatory Visit: Payer: Medicare Other | Admitting: Internal Medicine

## 2018-09-30 VITALS — BP 117/61 | HR 61 | Ht 60.0 in | Wt 142.8 lb

## 2018-09-30 DIAGNOSIS — E782 Mixed hyperlipidemia: Secondary | ICD-10-CM

## 2018-09-30 DIAGNOSIS — I10 Essential (primary) hypertension: Secondary | ICD-10-CM

## 2018-09-30 DIAGNOSIS — Z01810 Encounter for preprocedural cardiovascular examination: Secondary | ICD-10-CM | POA: Diagnosis not present

## 2018-09-30 DIAGNOSIS — I255 Ischemic cardiomyopathy: Secondary | ICD-10-CM | POA: Diagnosis not present

## 2018-09-30 NOTE — Patient Instructions (Signed)
Medication Instructions:  Continue current medications If you need a refill on your cardiac medications before your next appointment, please call your pharmacy.   Testing/Procedures: Your physician has requested that you have an echocardiogram. Echocardiography is a painless test that uses sound waves to create images of your heart. It provides your doctor with information about the size and shape of your heart and how well your heart's chambers and valves are working. This procedure takes approximately one hour. There are no restrictions for this procedure. -- done at [address] - [address]rd Floor  Follow-Up: At [facility], you and your health needs are our priority.  As part of our continuing mission to provide you with exceptional heart care, we have created designated Provider Care Teams.  These Care Teams include your primary Cardiologist (physician) and Advanced Practice Providers (APPs -  Physician Assistants and Nurse Practitioners) who all work together to provide you with the care you need, when you need it. You will need a follow up appointment in 4 weeks after echo.  You may see Dr. Rennis GoldenHilty or one of the following Advanced Practice Providers on your designated Care Team: Azalee CourseHao Meng, [place]PA-C . Micah FlesherAngela Duke, PA-C  Any Other Special Instructions Will Be Listed Below (If Applicable).

## 2018-09-30 NOTE — Progress Notes (Signed)
OFFICE NOTE  Chief Complaint:  Preoperative evaluation  Primary Care Physician: Jarome Matin, MD  HPI:  James Calhoun is an 84 year old male has been having some recent abdominal pain and was diagnosed with cholelithiasis. He was scheduled to have elective cholecystectomy and a preoperative EKG demonstrated bigeminal PVCs. He is unaware of these PVCs. In fact he does not report any chest pain or shortness of breath with exertion. He denies any new fatigue or change in his exercise tolerance. His past medical history significant for hypertension, but no known coronary artery disease, no diabetes and no evidence for dyslipidemia.  An EKG in the office today again shows bigeminal PVCs with a heart rate of 57 and a possible left atrial enlargement.  His stress test indicated a dense inferoapical defect suggestive of scar. The nuclear stress test was non-gated. He subsequently underwent an echocardiogram which showed infero-apical akinesis and an EF of 45-50%. 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 He is unaware of this event. There was no evidence for ischemia. He is on good medications for heart failure including metoprolol and losartan.   James Calhoun returns today for follow-up. He underwent successful surgery in July has had no issues. He continues to have problems with bad arthritis but denies chest pain or worsening shortness of breath. His EKG shows persistent ventricular bigeminy, despite his beta blocker.  I saw James Calhoun back in the office today. He continues to be in ventricular bigeminy. He is not reporting more fatigue and decreased exercise tolerance. He seems to chalk it up to being old, however, heart rate is quite low as his PVCs are nonconducted. Prior attempts to suppress them with the beta blocker been unsuccessful. Decrease in the beta blocker does not seem to help with the PVCs either.  09/30/2018  James Calhoun is seen today for preoperative evaluation.  I last saw him  in 2016, therefore he is considered a new patient.  Time he was having ventricular bigeminy.  An echo showed an LVEF of 45-50%.  He also had a Myoview stress test which showed a prior inferior MI.  Recently has had problems with a hernia and there plans to possibly have that repaired by Dr. Derrell Lolling.  An EKG was performed which shows PVCs as well as a right bundle branch block.  He had an incomplete right bundle branch block in the past.  He has not been seen in follow-up because it is difficult for him to get out of the house.  He reports significant discomfort and that is wise pursuing surgery.  According to his son he denies any shortness of breath or chest pain.  He is not very physically active, basically walking short distances across his house.  PMHx:  Past Medical History:  Diagnosis Date  . Arthritis   . Bigeminal rhythm    bigeminal PVCs ; see prior EKGs   . ED (erectile dysfunction)   . GERD (gastroesophageal reflux disease)   . Hip pain   . History of ischemic cardiomyopathy   . HOH (hard of hearing)   . Hypertension   . Low back pain    spinal stenosis   . MI (myocardial infarction) [facility])    see LOV cardiology Dr Rennis Golden epic 04-02-15; notes that patient had stress test and [facility] that "suggest he had a prior inferior MI"; patient and son say they are unaware of this   . Osteoporosis   . Thin skin    bruises easy  Past Surgical History:  Procedure Laterality Date  . CHOLECYSTECTOMY N/A 04/19/2014   Procedure: LAPAROSCOPIC CHOLECYSTECTOMY;  Surgeon: Shelly Rubensteinouglas A Blackman, MD;  Location: [facility];  Service: General;  Laterality: N/A;  . colonscopy  06-28-2008  . EYE SURGERY Bilateral yrs ago   cataract lens replacments    FAMHx:  Family History  Problem Relation Age of Onset  . Cancer Mother   . Heart disease Father   . Heart disease Brother     SOCHx:   reports that he quit smoking about 49 years ago. His smoking use included cigarettes. He has a 7.50 pack-year smoking  history. He has never used smokeless tobacco. He reports that he does not drink alcohol or use drugs.  ALLERGIES:  No Known Allergies  ROS: Pertinent items noted in HPI and remainder of comprehensive ROS otherwise negative.  HOME MEDS: Current Outpatient Medications  Medication Sig Dispense Refill  . acetaminophen (TYLENOL) 500 MG tablet Take 500 mg by mouth at bedtime as needed (pain).     Marland Kitchen. aspirin 81 MG tablet Take 81 mg by mouth daily.    . Carboxymethylcellul-Glycerin (OPTIVE) 0.5-0.9 % SOLN Place 1 drop into both eyes daily as needed (dry eyes).    . gabapentin (NEURONTIN) 300 MG capsule Take 300 mg by mouth at bedtime.  1  . losartan-hydrochlorothiazide (HYZAAR) 100-25 MG tablet Take 1 tablet by mouth daily.    . metoprolol succinate (TOPROL-XL) 25 MG 24 hr tablet Take 25 mg by mouth daily.     . Probiotic Product (ALIGN) 4 MG CAPS Take 4 mg by mouth daily.     . traMADol (ULTRAM) 50 MG tablet Take 50 mg by mouth at bedtime.     . verapamil (CALAN-SR) 240 MG CR tablet Take 240 mg by mouth at bedtime.     No current facility-administered medications for this visit.     LABS/IMAGING: No results found for this or any previous visit (from the past 48 hour(s)). No results found.  VITALS: BP 117/61   Pulse 61   Ht 5' (1.524 m)   Wt 142 lb 12.8 oz (64.8 kg)   BMI 27.89 kg/m   EXAM: General appearance: alert and no distress Neck: no carotid bruit and no JVD Lungs: clear to auscultation bilaterally Heart: regular rate and rhythm, S1, S2 normal, no murmur, click, rub or gallop Abdomen: soft, non-tender; bowel sounds normal; no masses,  no organomegaly Extremities: extremities normal, atraumatic, no cyanosis or edema Pulses: 2+ and symmetric Skin: Skin color, texture, turgor normal. No rashes or lesions Neurologic: Grossly normal Psych: Normal  EKG: Sinus rhythm with PVCs, RBBB at 61-personally reviewed  ASSESSMENT: 1. Indeterminate perioperative risk 2. Ischemic  cardiomyopathy, EF 45-50%, inferoapical scar (2016) 3. 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 Hernia  PLAN: 1.   James Calhoun is desiring hernia repair.  I last saw him in 2016, at which time he was noted to have inferoapical scar and EF 45 to 50%.  He was having bigeminal PVCs although they are less frequent on EKG today.  He is on beta-blocker and aspirin.  I would advise a repeat echocardiogram.  If LVEF is stable and not significantly declined, then he would be at acceptable risk for surgery.  Plan follow-up with me afterwards.  Thanks for the consultation.  Chrystie NoseKenneth C. Hilty, MD, [facility] ParkFACC, FACP  [place]  [facility]  Medical Director of the [facility]  [facility] Diplomate of the American Board  of Clinical Lipidology Attending Cardiologist  Direct Dial: [phone]  Fax: [phone]  Website:  [email or web address] Nicely [email or web address] 09/30/2018, 10:30 AM

## 2018-10-10 ENCOUNTER — Other Ambulatory Visit: Payer: Self-pay

## 2018-10-10 ENCOUNTER — Ambulatory Visit (HOSPITAL_COMMUNITY): Payer: Medicare Other | Attending: Internal Medicine

## 2018-10-10 ENCOUNTER — Encounter (INDEPENDENT_AMBULATORY_CARE_PROVIDER_SITE_OTHER): Payer: Self-pay

## 2018-10-10 DIAGNOSIS — I255 Ischemic cardiomyopathy: Secondary | ICD-10-CM | POA: Diagnosis not present

## 2018-10-10 DIAGNOSIS — Z01810 Encounter for preprocedural cardiovascular examination: Secondary | ICD-10-CM | POA: Insufficient documentation

## 2018-10-14 ENCOUNTER — Other Ambulatory Visit (HOSPITAL_COMMUNITY): Payer: Medicare Other

## 2018-11-08 NOTE — Patient Instructions (Signed)
James Calhoun  11/08/2018   Your procedure is scheduled on: 11-16-2018    Report to [facility] Main  Entrance     Report to admitting at 6:30AM    Call this number if you have problems the morning of surgery [phone]      Remember: Do not eat food or drink liquids :After Midnight. BRUSH YOUR TEETH MORNING OF SURGERY AND RINSE YOUR MOUTH OUT, NO CHEWING GUM CANDY OR MINTS.     Take these medicines the morning of surgery with A SIP OF WATER: metoprolol                                You may not have any metal on your body including hair pins and              piercings  Do not wear jewelry, make-up, lotions, powders or perfumes, deodorant                   Men may shave face and neck.   Do not bring valuables to the hospital. [place] IS NOT             RESPONSIBLE   FOR VALUABLES.  Contacts, dentures or bridgework may not be worn into surgery.  Leave suitcase in the car. After surgery it may be brought to your room.                   Please read over the following fact sheets you were given: _____________________________________________________________________             [facility] - Preparing for Surgery Before surgery, you can play an important role.  Because skin is not sterile, your skin needs to be as free of germs as possible.  You can reduce the number of germs on your skin by washing with CHG (chlorahexidine gluconate) soap before surgery.  CHG is an antiseptic cleaner which kills germs and bonds with the skin to continue killing germs even after washing. Please DO NOT use if you have an allergy to CHG or antibacterial soaps.  If your skin becomes reddened/irritated stop using the CHG and inform your nurse when you arrive at Short Stay. Do not shave (including legs and underarms) for at least 48 hours prior to the first CHG shower.  You may shave your face/neck. Please follow these instructions carefully:  1.  Shower with CHG Soap the  night before surgery and the  morning of Surgery.  2.  If you choose to wash your hair, wash your hair first as usual with your  normal  shampoo.  3.  After you shampoo, rinse your hair and body thoroughly to remove the  shampoo.                           4.  Use CHG as you would any other liquid soap.  You can apply chg directly  to the skin and wash                       Gently with a scrungie or clean washcloth.  5.  Apply the CHG Soap to your body ONLY FROM THE NECK DOWN.   Do not use on face/ open  Wound or open sores. Avoid contact with eyes, ears mouth and genitals (private parts).                       Wash face,  Genitals (private parts) with your normal soap.             6.  Wash thoroughly, paying special attention to the area where your surgery  will be performed.  7.  Thoroughly rinse your body with warm water from the neck down.  8.  DO NOT shower/wash with your normal soap after using and rinsing off  the CHG Soap.                9.  Pat yourself dry with a clean towel.            10.  Wear clean pajamas.            11.  Place clean sheets on your bed the night of your first shower and do not  sleep with pets. Day of Surgery : Do not apply any lotions/deodorants the morning of surgery.  Please wear clean clothes to the hospital/surgery center.  FAILURE TO FOLLOW THESE INSTRUCTIONS MAY RESULT IN THE CANCELLATION OF YOUR SURGERY PATIENT SIGNATURE_________________________________  NURSE SIGNATURE__________________________________  ________________________________________________________________________

## 2018-11-08 NOTE — Progress Notes (Signed)
lov cards Dr Ellene Route 09-30-18 epic   Echo 10-10-18 epic   See echo 10-10-18  note for cardiac clearance  ekg 09-30-18 epic

## 2018-11-09 ENCOUNTER — Encounter (HOSPITAL_COMMUNITY)
Admission: RE | Admit: 2018-11-09 | Discharge: 2018-11-09 | Disposition: A | Discharge status: Home / Self Care | Payer: Medicare Other | Source: Ambulatory Visit | Attending: General Surgery | Admitting: General Surgery

## 2018-11-09 ENCOUNTER — Encounter (HOSPITAL_COMMUNITY): Payer: Self-pay

## 2018-11-09 ENCOUNTER — Other Ambulatory Visit: Payer: Self-pay

## 2018-11-09 DIAGNOSIS — Z01812 Encounter for preprocedural laboratory examination: Secondary | ICD-10-CM | POA: Diagnosis present

## 2018-11-09 LAB — BASIC METABOLIC PANEL
Anion gap: 8 (ref 5–15)
BUN: 19 mg/dL (ref 8–23)
CO2: 27 mmol/L (ref 22–32)
Calcium: 8.9 mg/dL (ref 8.9–10.3)
Chloride: 104 mmol/L (ref 98–111)
Creatinine, Ser: 1.19 mg/dL (ref 0.61–1.24)
GFR calc Af Amer: >60 mL/min (ref >60)
GFR calc non Af Amer: 54 mL/min — ABNORMAL LOW (ref >60)
Glucose, Bld: 140 mg/dL — ABNORMAL HIGH (ref 70–99)
Potassium: 3.9 mmol/L (ref 3.5–5.1)
SODIUM: 139 mmol/L (ref 135–145)

## 2018-11-09 LAB — CBC
HCT: 37 % — ABNORMAL LOW (ref 39.0–52.0)
Hemoglobin: 11.8 g/dL — ABNORMAL LOW (ref 13.0–17.0)
MCH: 32.4 pg (ref 26.0–34.0)
MCHC: 31.9 g/dL (ref 30.0–36.0)
MCV: 101.6 fL — ABNORMAL HIGH (ref 80.0–100.0)
Platelets: 181 10*3/uL (ref 150–400)
RBC: 3.64 MIL/uL — ABNORMAL LOW (ref 4.22–5.81)
RDW: 14.4 % (ref 11.5–15.5)
WBC: 7.1 10*3/uL (ref 4.0–10.5)
nRBC: 0 % (ref 0.0–0.2)

## 2018-11-10 ENCOUNTER — Ambulatory Visit: Payer: Medicare Other | Admitting: Internal Medicine

## 2018-11-11 NOTE — Anesthesia Preprocedure Evaluation (Addendum)
Anesthesia Evaluation  Patient identified by MRN, date of birth, ID band Patient awake    Reviewed: Allergy & Precautions, NPO status , Patient's Chart, lab work & pertinent test results, reviewed documented beta blocker date and time   Airway Mallampati: II  TM Distance: >3 FB Neck ROM: Full    Dental  (+) Edentulous Lower, Edentulous Upper   Pulmonary former smoker,    Pulmonary exam normal breath sounds clear to auscultation       Cardiovascular hypertension, Pt. on home beta blockers and Pt. on medications + Past MI  Normal cardiovascular exam Rhythm:Regular Rate:Normal  Echo 10/10/18 - Left ventricle: The cavity size was normal. There was mild concentric hypertrophy. Systolic function was normal. The estimated ejection fraction was in the range of 60% to 65%. Wall motion was normal; there were no regional wall motion abnormalities. Features are consistent with a pseudonormal left ventricular filling pattern, with concomitant abnormal relaxation and increased filling pressure (grade 2 diastolic dysfunction). Doppler parameters are consistent with high ventricular filling pressure. - Aortic valve: Valve mobility was restricted. Transvalvular   velocity was within the normal range. There was no stenosis. There was no regurgitation. Peak velocity (S): 174.24 cm/s. Mean gradient (S): 7 mm Hg. Valve area (VTI): 1.84 cm^2. Valve area (Vmax): 2.04 cm^2. Valve area (Vmean): 1.68 cm^2. - Mitral valve: Transvalvular velocity was within the normal range. There was no evidence for stenosis. There was mild regurgitation. - Left atrium: The atrium was moderately dilated. - Right ventricle: The cavity size was normal. Wall thickness was normal. Systolic function was normal. - Atrial septum: No defect or patent foramen ovale was identified by color flow Doppler. - Tricuspid valve: There was trivial regurgitation. - Pulmonary arteries: Systolic  pressure was within the normal range. PA peak pressure: 27 mm Hg (S).   Neuro/Psych negative neurological ROS  negative psych ROS   GI/Hepatic Neg liver ROS, GERD  ,  Endo/Other  negative endocrine ROS  Renal/GU negative Renal ROS     Musculoskeletal  (+) Arthritis ,   Abdominal (+) - obese,   Peds  Hematology negative hematology ROS (+)   Anesthesia Other Findings   Reproductive/Obstetrics                                                            Anesthesia Evaluation  Patient identified by MRN, date of birth, ID band Patient awake    Reviewed: Allergy & Precautions, H&P , NPO status , Patient's Chart, lab work & pertinent test results, reviewed documented beta blocker date and time   Airway Mallampati: II TM Distance: >3 FB Neck ROM: Full    Dental  (+) Dental Advisory Given   Pulmonary neg pulmonary ROS, former smoker,  breath sounds clear to auscultation        Cardiovascular hypertension, Pt. on medications and Pt. on home beta blockers + CAD, + Past MI and +CHF Rhythm:Regular Rate:Normal  Echo 04/11/2014 - Left ventricle: The cavity size was normal. Wall thickness was  increased in a pattern of mild LVH. Systolic function was mildly  reduced. The estimated ejection fraction was in the range of 45%  to 50%. The apical septal and apical inferior walls and the true  apex were akinetic. Doppler parameters are consistent with  abnormal left ventricular  relaxation (grade 1 diastolic  dysfunction). - Aortic valve: Trileaflet; moderately calcified leaflets.   Sclerosis without stenosis. - Mitral valve: Mildly calcified annulus. Mildly calcified leaflets. There was trivial regurgitation. - Left atrium: The atrium was mildly dilated. - Right ventricle: The cavity size was normal. Systolic function   was normal. - Right atrium: The atrium was mildly dilated. - Pulmonary arteries: No complete TR doppler jet so unable to  estimate PA  systolic pressure. - Inferior vena cava: The vessel was normal in size. The   respirophasic diameter changes were in the normal range (= 50%),  consistent with normal central venous pressure.  Impressions: - Normal LV size with mild LV hypertrophy. EF 45-50% with wall  motion abnormalities as noted above. Normal RV size and systolic  function. Aortic sclerosis wtihout significant stenosis.    Neuro/Psych negative neurological ROS  negative psych ROS   GI/Hepatic Neg liver ROS, GERD-  ,  Endo/Other  negative endocrine ROS  Renal/GU negative Renal ROS     Musculoskeletal negative musculoskeletal ROS (+)   Abdominal   Peds  Hematology negative hematology ROS (+)   Anesthesia Other Findings   Reproductive/Obstetrics negative OB ROS                          Anesthesia Physical Anesthesia Plan  ASA: III  Anesthesia Plan: General   Post-op Pain Management:    Induction: Intravenous  Airway Management Planned: Oral ETT  Additional Equipment:   Intra-op Plan:   Post-operative Plan: Extubation in OR  Informed Consent: I have reviewed the patients History and Physical, chart, labs and discussed the procedure including the risks, benefits and alternatives for the proposed anesthesia with the patient or authorized representative who has indicated his/her understanding and acceptance.   Dental advisory given  Plan Discussed with: CRNA  Anesthesia Plan Comments:         Anesthesia Quick Evaluation  Anesthesia Physical Anesthesia Plan  ASA: III  Anesthesia Plan: General   Post-op Pain Management: GA combined w/ Regional for post-op pain   Induction: Intravenous  PONV Risk Score and Plan: 4 or greater and Ondansetron, Dexamethasone and Treatment may vary due to age or medical condition  Airway Management Planned: Oral ETT  Additional Equipment: None  Intra-op Plan:   Post-operative Plan: Extubation in OR  Informed Consent: I  have reviewed the patients History and Physical, chart, labs and discussed the procedure including the risks, benefits and alternatives for the proposed anesthesia with the patient or authorized representative who has indicated his/her understanding and acceptance.     Dental advisory given  Plan Discussed with: CRNA  Anesthesia Plan Comments: (See PST note 11/09/18, Jodell Cipro, PA-C)       Anesthesia Quick Evaluation

## 2018-11-11 NOTE — Progress Notes (Signed)
Anesthesia Chart Review   Case:  [id number] Date/Time:  11/16/18 0815   Procedure:  OPEN REPAIR RIGHT INGUINAL HERNIA WITH MESH (Right ) - GENERAL AND TAP BLOCK   Anesthesia type:  General   Pre-op diagnosis:  RIGHT INGUINAL HERNIA   Location:  [facility] ROOM 01 / [facility]   Surgeon:  Claud Kelp, MD      DISCUSSION: 85 yo former smoker (7.5 pack years, quit 10/19/68) with h/o MI, ischemic cardiomyopathy, HTN, bigeminal PVCs, right inguinal hernia scheduled for above surgery on 11/16/18 with Dr. Claud Kelp.    Pt last seen by cardiology, Dr. Zoila Shutter, on 09/30/18.  Per Dr. Blanchie Dessert note, " I last saw him in 2016, at which time he was noted to have inferoapical scar and EF 45 to 50%.  He was having bigeminal PVCs although they are less frequent on EKG today.  He is on beta-blocker and aspirin.  I would advise a repeat echocardiogram.  If LVEF is stable and not significantly declined, then he would be at acceptable risk for surgery.  Plan follow-up with me afterwards."  Echo on 10/10/18, per note on results by Dr. Rennis Golden, "Normal EF, moderate diastolic dysfunction with elevated LV filling pressure. Ok for surgery."  Pt can proceed with planned procedure barring acute status change.  VS: BP (!) 153/57   Pulse 66   Temp 36.6 C (Oral)   Resp 16   SpO2 100%   PROVIDERS: Jarome Matin, MD is PCP   Zoila Shutter, MD is Cardiology LABS: Labs reviewed: Acceptable for surgery. (all labs ordered are listed, but only abnormal results are displayed)  Labs Reviewed  BASIC METABOLIC PANEL - Abnormal; Notable for the following components:      Result Value   Glucose, Bld 140 (*)    GFR calc non Af Amer 54 (*)    All other components within normal limits  CBC - Abnormal; Notable for the following components:   RBC 3.64 (*)    Hemoglobin 11.8 (*)    HCT 37.0 (*)    MCV 101.6 (*)    All other components within normal limits     IMAGES:   EKG: 09/30/18  Rate 61 bpm Sinus rhythm with  premature supraventricular complexes Lest axis deviation Right bundle branch block  Abnormal ECG (Similar findings on last ECG)  CV: Echo 10/10/18 Study Conclusions  - Left ventricle: The cavity size was normal. There was mild   concentric hypertrophy. Systolic function was normal. The   estimated ejection fraction was in the range of 60% to 65%. Wall   motion was normal; there were no regional wall motion   abnormalities. Features are consistent with a pseudonormal left   ventricular filling pattern, with concomitant abnormal relaxation   and increased filling pressure (grade 2 diastolic dysfunction).   Doppler parameters are consistent with high ventricular filling   pressure. - Aortic valve: Valve mobility was restricted. Transvalvular   velocity was within the normal range. There was no stenosis.   There was no regurgitation. Peak velocity (S): 174.24 cm/s. Mean   gradient (S): 7 mm Hg. Valve area (VTI): 1.84 cm^2. Valve area   (Vmax): 2.04 cm^2. Valve area (Vmean): 1.68 cm^2. - Mitral valve: Transvalvular velocity was within the normal range.   There was no evidence for stenosis. There was mild regurgitation. - Left atrium: The atrium was moderately dilated. - Right ventricle: The cavity size was normal. Wall thickness was   normal. Systolic function was normal. -  Atrial septum: No defect or patent foramen ovale was identified   by color flow Doppler. - Tricuspid valve: There was trivial regurgitation. - Pulmonary arteries: Systolic pressure was within the normal   range. PA peak pressure: 27 mm Hg (S).  Stress Test 03/30/14 Overall Impression:  Intermediate risk stress nuclear study with a large inferior scar and a large apical scar. There is no significant reversible ischemia. The study was not gated due to ectopy.. Past Medical History:  Diagnosis Date  . Arthritis   . Bigeminal rhythm    bigeminal PVCs ; see prior EKGs   . ED (erectile dysfunction)   . GERD  (gastroesophageal reflux disease)   . Hip pain   . History of ischemic cardiomyopathy   . HOH (hard of hearing)   . Hypertension   . Low back pain    spinal stenosis   . MI (myocardial infarction) [facility])    see LOV cardiology Dr Rennis Golden epic 04-02-15; notes that patient had stress test and [facility] that "suggest he had a prior inferior MI"; patient and son say they are unaware of this   . Osteoporosis   . Thin skin    bruises easy    Past Surgical History:  Procedure Laterality Date  . CHOLECYSTECTOMY N/A 04/19/2014   Procedure: LAPAROSCOPIC CHOLECYSTECTOMY;  Surgeon: Shelly Rubenstein, MD;  Location: [facility];  Service: General;  Laterality: N/A;  . colonscopy  06-28-2008  . EYE SURGERY Bilateral yrs ago   cataract lens replacments    MEDICATIONS: . aspirin 81 MG tablet  . furosemide (LASIX) 20 MG tablet  . gabapentin (NEURONTIN) 300 MG capsule  . losartan (COZAAR) 50 MG tablet  . metoprolol succinate (TOPROL-XL) 25 MG 24 hr tablet  . traMADol (ULTRAM) 50 MG tablet  . verapamil (CALAN-SR) 240 MG CR tablet   No current facility-administered medications for this encounter.      Janey Genta [facility] Pre-Surgical Testing [phone] 11/11/18 4:32 PM

## 2018-11-12 NOTE — H&P (Addendum)
Maurene Capes Location: [facility] Patient #: [id number] DOB: [birth year] Married / Language: English / Race: White Male      History of Present Illness        This is a pleasant but elderly and somewhat feeble 85 year old man, referred by Dr. Jarome Matin for evaluation of a progressive symptomatic right inguinal hernia. He has been evaluated for cardiovascular disease by Dr. Rennis Golden in 2016. He has coronary artery disease but never had a heart attack      He has had a right inguinal hernia for some time a perhaps 2 years. It has gotten bigger and become painful. No history of incarceration, abdominal pain nausea or vomiting. Appetite excellent. Bowel movements functional. His son is with him throughout the encounter today. The patient is adamant that he wants to continue to live at home independently and that he never wants to go to a nursing home.      Past history significant for degenerative disc disease, spinal stenosis, hypertension, coronary coronary artery disease. He ambulates with a walker. Does not report any falls but is a little bit unstable. Has had ventricular bigeminy in the past has macular degeneration last colonoscopy 2009. Cholecystectomy 2015. Family history reviewed. Noncontributory Social history reveals he is a widower. What dye from ovarian cancer. Remote tobacco use. 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 Lives alone. Ambulates with a walker. Says he urinates uneventfully but does wear a diaper.      I found a large grapefruit sized inguinal hernia on the right side. No hernia on the left. With him supine and was able to reduce this after about 30 or 45 seconds. I agree with Dr. Eloise Harman that it would be in his best interest to undergo elective surgery because I think he is heading for an incarceration or possible strangulation at some point. I think the risk of surgery is less than the risk of observation over time  in this gentleman He agrees.      He'll be scheduled for open repair of right inguinal hernia with mesh. I discussed the indications, details, techniques, numerous risk of the surgery with him and his son. They're aware of the risk of bleeding, infection, nerve damage with chronic pain, recurrence of the hernia, damage and removal of the testicle, injury to the intestine with major reconstructive surgery although unlikely. Urinary retention Cardiac, pulmonary, and thromboembolic problems. He understands all this. All his questions are answered. He agrees with this plan.     He will be observed in the hospital overnight. [facility] We will request cardiac and medical clearance with his PCP. Physical therapy and occupational therapy consults will be requested I suspect he will need in home care.      Addendum Note Patient was cleared for surgery by Dr. Jarome Matin. However, patient was seen at preop appointment today. Dr. Bradley Ferris of the anesthesia Department and requested he undergo formal cardiac risk assessment and clearance because of the intended referral to Dr. Sharrell Ku by Dr. Rennis Golden 3 years ago which was never done. Obviously we will now have to cancel his surgery for Monday and we will refer him to Dr. Sharrell Ku to see if he needs a pacemaker. My office will contact the patient and will assist with the referral   ADDendum:  ECHO OK., EF 60-65%  "acceptable risk" per Dr. Rennis Golden   Past Surgical History  Gallbladder Surgery - Laparoscopic   Allergies  No Known  Drug Allergies  Allergies Reconciled   Medication History  Losartan Potassium-HCTZ (100-25MG  Tablet, Oral) Active. traMADol HCl (50MG  Tablet, Oral) Active. Verapamil HCl ER (240MG  Tablet ER, Oral) Active. Aspirin (81MG  Tablet DR, Oral) Active. Align (Oral) Specific strength unknown - Active. MiraLax (Oral) Active. Senna (Oral) Specific strength unknown - Active. Medications  Reconciled  Social History  Alcohol use  Remotely quit alcohol use. Caffeine use  Coffee, Tea. No drug use  Tobacco use  Former smoker.  Family History Family history unknown  First Degree Relatives   Other Problems Arthritis  Back Pain  High blood pressure     Review of Systems  General Not Present- Appetite Loss, Chills, Fatigue, Fever, Night Sweats, Weight Gain and Weight Loss. Skin Present- New Lesions and Non-Healing Wounds. Not Present- Change in Wart/Mole, Dryness, Hives, Jaundice, Rash and Ulcer. HEENT Present- Hearing Loss. Not Present- Earache, Hoarseness, Nose Bleed, Oral Ulcers, Ringing in the Ears, Seasonal Allergies, Sinus Pain, Sore Throat, Visual Disturbances, Wears glasses/contact lenses and Yellow Eyes. Respiratory Not Present- Bloody sputum, Chronic Cough, Difficulty Breathing, Snoring and Wheezing. Breast Not Present- Breast Mass, Breast Pain, Nipple Discharge and Skin Changes. Cardiovascular Present- Swelling of Extremities. Not Present- Chest Pain, Difficulty Breathing Lying Down, Leg Cramps, Palpitations, Rapid Heart Rate and Shortness of Breath. Musculoskeletal Present- Back Pain, Joint Pain and Joint Stiffness. Not Present- Muscle Pain, Muscle Weakness and Swelling of Extremities. Neurological Present- Decreased Memory and Trouble walking. Not Present- Fainting, Headaches, Numbness, Seizures, Tingling, Tremor and Weakness. Psychiatric Not Present- Anxiety, Bipolar, Change in Sleep Pattern, Depression, Fearful and Frequent crying. Endocrine Not Present- Cold Intolerance, Excessive Hunger, Hair Changes, Heat Intolerance, Hot flashes and New Diabetes. Hematology Not Present- Blood Thinners, Easy Bruising, Excessive bleeding, Gland problems, HIV and Persistent Infections.  Vitals  Weight: 140.6 lb Height: 60in Body Surface Area: 1.61 m Body Mass Index: 27.46 kg/m  Temp.: 98.38F(Temporal)  Pulse: 61 (Regular)  P.OX: 96% (Room air) BP:  122/84 (Sitting, Left Arm, Standard)     Physical Exam General Mental Status-Alert. General Appearance-Consistent with stated age. Hydration-Well hydrated. Voice-Normal. Note: Pleasant. Heart of hearing. Ambulating with walker but somewhat feeble. Has very reasonable understanding of the proposed procedure and its risks.   Head and Neck Head-normocephalic, atraumatic with no lesions or palpable masses. Trachea-midline. Thyroid Gland Characteristics - normal size and consistency.  Eye Eyeball - Bilateral-Extraocular movements intact. Sclera/Conjunctiva - Bilateral-No scleral icterus.  Chest and Lung Exam Chest and lung exam reveals -quiet, even and easy respiratory effort with no use of accessory muscles and on auscultation, normal breath sounds, no adventitious sounds and normal vocal resonance. Inspection Chest Wall - Normal. Back - normal.  Breast Breast - Left-Symmetric, Non Tender, No Biopsy scars, no Dimpling, No Inflammation, No Lumpectomy scars, No Mastectomy scars, No Peau d' Orange. Breast - Right-Symmetric, Non Tender, No Biopsy scars, no Dimpling, No Inflammation, No Lumpectomy scars, No Mastectomy scars, No Peau d' Orange. Breast Lump-No Palpable Breast Mass.  Cardiovascular Cardiovascular examination reveals -normal heart sounds, regular rate and rhythm with no murmurs and normal pedal pulses bilaterally.  Abdomen Inspection Inspection of the abdomen reveals - No Hernias. Skin - Scar - no surgical scars. Palpation/Percussion Palpation and Percussion of the abdomen reveal - Soft, Non Tender, No Rebound tenderness, No Rigidity (guarding) and No hepatosplenomegaly. Auscultation Auscultation of the abdomen reveals - Bowel sounds normal.  Male Genitourinary Note: Grapefruit sized right inguinal hernia. A little bit painful but able to reduce when supine. No evidence of hernia on the  left. No evidence of umbilical  hernia.   Peripheral Vascular Note: Femoral and radial pulses palpable. Regular rhythm.   Neurologic Neurologic evaluation reveals -alert and oriented x 3 with no impairment of recent or remote memory. Mental Status-Normal.  Musculoskeletal Normal Exam - Left-Upper Extremity Strength Normal and Lower Extremity Strength Normal. Normal Exam - Right-Upper Extremity Strength Normal and Lower Extremity Strength Normal.  Lymphatic Head & Neck  General Head & Neck Lymphatics: Bilateral - Description - Normal. Axillary  General Axillary Region: Bilateral - Description - Normal. Tenderness - Non Tender. Femoral & Inguinal  Generalized Femoral & Inguinal Lymphatics: Bilateral - Description - Normal. Tenderness - Non Tender.    Assessment & Plan  RIGHT INGUINAL HERNIA (K40.90)    You have a large right inguinal hernia With some effort I was able to completely reduce this This has apparently been getting larger and causing pain I risk of incarceration and strangulation which is very dangerous Despite your advanced age, I recommend surgical repair of your hernia with mesh. I discussed the indications, techniques, and risk of the surgery with you and your son in detail Please read the patient information booklet that I went over with the You have agree with this plan  We will ask your primary doctor, Dr. Eloise HarmanPaterson, for medical clearance to proceed with the surgery We will ask you to stop the aspirin 3 days preop  We will keep you in the hospital one night as we discussed We will ask physical therapy to assist you  You told me that you do not want to go to a nursing home for any reason and I will honor that. You may you may need home health aid and home health nursing to assist with your rehabilitation  SPINAL STENOSIS, LUMBOSACRAL REGION (M48.07) CORONARY ARTERY DISEASE, OCCLUSIVE (I25.10) HYPERTENSION, BENIGN (I10)    Ronnisha Felber M. Derrell LollingIngram, M.D., [facility] Center[facility]ACS [facility] General and Minimally invasive Surgery Breast and Colorectal Surgery Office:   [phone] Pager:   [phone]

## 2018-11-16 ENCOUNTER — Ambulatory Visit (HOSPITAL_COMMUNITY)
Admission: RE | Admit: 2018-11-16 | Discharge: 2018-11-18 | Disposition: A | Discharge status: Skilled Nursing Facility | Payer: Medicare Other | Attending: General Surgery | Admitting: General Surgery

## 2018-11-16 ENCOUNTER — Ambulatory Visit (HOSPITAL_COMMUNITY): Payer: Medicare Other | Admitting: Anesthesiology

## 2018-11-16 ENCOUNTER — Ambulatory Visit (HOSPITAL_COMMUNITY): Payer: Medicare Other | Admitting: Physician Assistant

## 2018-11-16 ENCOUNTER — Encounter (HOSPITAL_COMMUNITY): Payer: Self-pay | Admitting: *Deleted

## 2018-11-16 ENCOUNTER — Encounter (HOSPITAL_COMMUNITY)
Admission: RE | Disposition: A | Discharge status: Skilled Nursing Facility | Payer: Self-pay | Source: Home / Self Care | Attending: General Surgery

## 2018-11-16 ENCOUNTER — Other Ambulatory Visit: Payer: Self-pay

## 2018-11-16 DIAGNOSIS — Z79899 Other long term (current) drug therapy: Secondary | ICD-10-CM | POA: Insufficient documentation

## 2018-11-16 DIAGNOSIS — I2510 Atherosclerotic heart disease of native coronary artery without angina pectoris: Secondary | ICD-10-CM | POA: Insufficient documentation

## 2018-11-16 DIAGNOSIS — M1990 Unspecified osteoarthritis, unspecified site: Secondary | ICD-10-CM | POA: Insufficient documentation

## 2018-11-16 DIAGNOSIS — M4807 Spinal stenosis, lumbosacral region: Secondary | ICD-10-CM | POA: Insufficient documentation

## 2018-11-16 DIAGNOSIS — Z79891 Long term (current) use of opiate analgesic: Secondary | ICD-10-CM | POA: Insufficient documentation

## 2018-11-16 DIAGNOSIS — H3530 Unspecified macular degeneration: Secondary | ICD-10-CM | POA: Diagnosis not present

## 2018-11-16 DIAGNOSIS — Z7982 Long term (current) use of aspirin: Secondary | ICD-10-CM | POA: Diagnosis not present

## 2018-11-16 DIAGNOSIS — R339 Retention of urine, unspecified: Secondary | ICD-10-CM | POA: Insufficient documentation

## 2018-11-16 DIAGNOSIS — I10 Essential (primary) hypertension: Secondary | ICD-10-CM | POA: Diagnosis not present

## 2018-11-16 DIAGNOSIS — I252 Old myocardial infarction: Secondary | ICD-10-CM | POA: Insufficient documentation

## 2018-11-16 DIAGNOSIS — Z87891 Personal history of nicotine dependence: Secondary | ICD-10-CM | POA: Insufficient documentation

## 2018-11-16 DIAGNOSIS — K4090 Unilateral inguinal hernia, without obstruction or gangrene, not specified as recurrent: Secondary | ICD-10-CM | POA: Diagnosis present

## 2018-11-16 HISTORY — DX: Retention of urine, unspecified: R33.9

## 2018-11-16 HISTORY — PX: INGUINAL HERNIA REPAIR: SHX194

## 2018-11-16 SURGERY — REPAIR, HERNIA, INGUINAL, ADULT
Anesthesia: General | Laterality: Right

## 2018-11-16 MED ORDER — LIDOCAINE-EPINEPHRINE 1 %-1:100000 IJ SOLN
INTRAMUSCULAR | Status: AC
  Filled 2018-11-16: qty 1

## 2018-11-16 MED ORDER — DEXAMETHASONE SODIUM PHOSPHATE 10 MG/ML IJ SOLN
INTRAMUSCULAR | Status: DC | PRN
  Administered 2018-11-16: 8 mg via INTRAVENOUS

## 2018-11-16 MED ORDER — PROMETHAZINE HCL 25 MG/ML IJ SOLN: 6.2500 mg | INTRAMUSCULAR | Status: DC | PRN

## 2018-11-16 MED ORDER — SENNA 8.6 MG PO TABS
1.0000 | ORAL_TABLET | Freq: Two times a day (BID) | ORAL | Status: DC
  Administered 2018-11-16 – 2018-11-18 (×4): 8.6 mg via ORAL
  Filled 2018-11-16 (×4): qty 1

## 2018-11-16 MED ORDER — ROCURONIUM BROMIDE 100 MG/10ML IV SOLN
INTRAVENOUS | Status: AC
  Filled 2018-11-16: qty 1

## 2018-11-16 MED ORDER — ROCURONIUM BROMIDE 10 MG/ML (PF) SYRINGE
PREFILLED_SYRINGE | INTRAVENOUS | Status: DC | PRN
  Administered 2018-11-16: 30 mg via INTRAVENOUS
  Administered 2018-11-16: 10 mg via INTRAVENOUS

## 2018-11-16 MED ORDER — CEFAZOLIN SODIUM-DEXTROSE 2-4 GM/100ML-% IV SOLN
INTRAVENOUS | Status: AC
  Filled 2018-11-16: qty 100

## 2018-11-16 MED ORDER — PHENYLEPHRINE 40 MCG/ML (10ML) SYRINGE FOR IV PUSH (FOR BLOOD PRESSURE SUPPORT)
PREFILLED_SYRINGE | INTRAVENOUS | Status: AC
  Filled 2018-11-16: qty 10

## 2018-11-16 MED ORDER — ONDANSETRON HCL 4 MG/2ML IJ SOLN
INTRAMUSCULAR | Status: DC | PRN
  Administered 2018-11-16: 4 mg via INTRAVENOUS

## 2018-11-16 MED ORDER — DEXAMETHASONE SODIUM PHOSPHATE 10 MG/ML IJ SOLN
INTRAMUSCULAR | Status: AC
  Filled 2018-11-16: qty 1

## 2018-11-16 MED ORDER — FENTANYL CITRATE (PF) 100 MCG/2ML IJ SOLN
INTRAMUSCULAR | Status: AC
  Filled 2018-11-16: qty 2

## 2018-11-16 MED ORDER — METOPROLOL SUCCINATE ER 25 MG PO TB24
25.0000 mg | ORAL_TABLET | Freq: Every day | ORAL | Status: DC
  Administered 2018-11-17: 25 mg via ORAL
  Filled 2018-11-16 (×2): qty 1

## 2018-11-16 MED ORDER — LOSARTAN POTASSIUM 50 MG PO TABS
50.0000 mg | ORAL_TABLET | Freq: Every day | ORAL | Status: DC
  Administered 2018-11-17 – 2018-11-18 (×2): 50 mg via ORAL
  Filled 2018-11-16 (×2): qty 1

## 2018-11-16 MED ORDER — FENTANYL CITRATE (PF) 100 MCG/2ML IJ SOLN
25.0000 ug | INTRAMUSCULAR | Status: DC | PRN
  Administered 2018-11-16 (×2): 25 ug via INTRAVENOUS

## 2018-11-16 MED ORDER — PROPOFOL 10 MG/ML IV BOLUS
INTRAVENOUS | Status: DC | PRN
  Administered 2018-11-16: 60 mg via INTRAVENOUS

## 2018-11-16 MED ORDER — TRAMADOL HCL 50 MG PO TABS
50.0000 mg | ORAL_TABLET | Freq: Four times a day (QID) | ORAL | Status: DC | PRN
  Administered 2018-11-16 – 2018-11-18 (×4): 50 mg via ORAL
  Filled 2018-11-16 (×4): qty 1

## 2018-11-16 MED ORDER — LIDOCAINE 2% (20 MG/ML) 5 ML SYRINGE
INTRAMUSCULAR | Status: DC | PRN
  Administered 2018-11-16: 50 mg via INTRAVENOUS

## 2018-11-16 MED ORDER — EPHEDRINE 5 MG/ML INJ
INTRAVENOUS | Status: AC
  Filled 2018-11-16: qty 10

## 2018-11-16 MED ORDER — LIDOCAINE 2% (20 MG/ML) 5 ML SYRINGE
INTRAMUSCULAR | Status: AC
  Filled 2018-11-16: qty 5

## 2018-11-16 MED ORDER — GABAPENTIN 300 MG PO CAPS
300.0000 mg | ORAL_CAPSULE | Freq: Every day | ORAL | Status: DC
  Administered 2018-11-16 – 2018-11-17 (×2): 300 mg via ORAL
  Filled 2018-11-16 (×2): qty 1

## 2018-11-16 MED ORDER — ONDANSETRON 4 MG PO TBDP: 4.0000 mg | ORAL_TABLET | Freq: Four times a day (QID) | ORAL | Status: DC | PRN

## 2018-11-16 MED ORDER — ONDANSETRON HCL 4 MG/2ML IJ SOLN: 4.0000 mg | Freq: Four times a day (QID) | INTRAMUSCULAR | Status: DC | PRN

## 2018-11-16 MED ORDER — BUPIVACAINE-EPINEPHRINE (PF) 0.5% -1:200000 IJ SOLN
INTRAMUSCULAR | Status: AC
  Filled 2018-11-16: qty 30

## 2018-11-16 MED ORDER — FENTANYL CITRATE (PF) 250 MCG/5ML IJ SOLN
INTRAMUSCULAR | Status: AC
  Filled 2018-11-16: qty 5

## 2018-11-16 MED ORDER — LACTATED RINGERS IV SOLN
INTRAVENOUS | Status: DC
  Administered 2018-11-16 – 2018-11-17 (×2): via INTRAVENOUS

## 2018-11-16 MED ORDER — FENTANYL CITRATE (PF) 100 MCG/2ML IJ SOLN
INTRAMUSCULAR | Status: DC | PRN
  Administered 2018-11-16 (×2): 25 ug via INTRAVENOUS

## 2018-11-16 MED ORDER — HYDROCODONE-ACETAMINOPHEN 5-325 MG PO TABS
1.0000 | ORAL_TABLET | ORAL | Status: DC | PRN
  Administered 2018-11-18: 1 via ORAL
  Filled 2018-11-16: qty 1

## 2018-11-16 MED ORDER — FUROSEMIDE 20 MG PO TABS
20.0000 mg | ORAL_TABLET | Freq: Every day | ORAL | Status: DC
  Administered 2018-11-18: 20 mg via ORAL
  Filled 2018-11-16 (×2): qty 1

## 2018-11-16 MED ORDER — BUPIVACAINE-EPINEPHRINE (PF) 0.25% -1:200000 IJ SOLN
INTRAMUSCULAR | Status: DC | PRN
  Administered 2018-11-16: 30 mL

## 2018-11-16 MED ORDER — MEPERIDINE HCL 50 MG/ML IJ SOLN: 6.2500 mg | INTRAMUSCULAR | Status: DC | PRN

## 2018-11-16 MED ORDER — PROPOFOL 10 MG/ML IV BOLUS
INTRAVENOUS | Status: AC
  Filled 2018-11-16: qty 20

## 2018-11-16 MED ORDER — PHENYLEPHRINE HCL 10 MG/ML IJ SOLN
INTRAMUSCULAR | Status: AC
  Filled 2018-11-16: qty 1

## 2018-11-16 MED ORDER — SUGAMMADEX SODIUM 200 MG/2ML IV SOLN
INTRAVENOUS | Status: AC
  Filled 2018-11-16: qty 2

## 2018-11-16 MED ORDER — SUGAMMADEX SODIUM 200 MG/2ML IV SOLN
INTRAVENOUS | Status: DC | PRN
  Administered 2018-11-16: 150 mg via INTRAVENOUS

## 2018-11-16 MED ORDER — BUPIVACAINE LIPOSOME 1.3 % IJ SUSP
20.0000 mL | Freq: Once | INTRAMUSCULAR | Status: AC
  Administered 2018-11-16: 20 mL
  Filled 2018-11-16: qty 20

## 2018-11-16 MED ORDER — PHENYLEPHRINE 40 MCG/ML (10ML) SYRINGE FOR IV PUSH (FOR BLOOD PRESSURE SUPPORT)
PREFILLED_SYRINGE | INTRAVENOUS | Status: DC | PRN
  Administered 2018-11-16 (×3): 80 ug via INTRAVENOUS

## 2018-11-16 MED ORDER — ACETAMINOPHEN 325 MG PO TABS
ORAL_TABLET | ORAL | Status: AC
  Filled 2018-11-16: qty 2

## 2018-11-16 MED ORDER — CEFAZOLIN SODIUM-DEXTROSE 2-4 GM/100ML-% IV SOLN
2.0000 g | Freq: Once | INTRAVENOUS | Status: AC
  Administered 2018-11-16: 2 g via INTRAVENOUS

## 2018-11-16 MED ORDER — HYDROMORPHONE HCL 1 MG/ML IJ SOLN: 0.5000 mg | INTRAMUSCULAR | Status: DC | PRN

## 2018-11-16 MED ORDER — VERAPAMIL HCL ER 240 MG PO TBCR
240.0000 mg | EXTENDED_RELEASE_TABLET | Freq: Every day | ORAL | Status: DC
  Administered 2018-11-16 – 2018-11-17 (×2): 240 mg via ORAL
  Filled 2018-11-16 (×2): qty 1

## 2018-11-16 MED ORDER — GABAPENTIN 300 MG PO CAPS
300.0000 mg | ORAL_CAPSULE | Freq: Two times a day (BID) | ORAL | Status: DC
  Administered 2018-11-16 – 2018-11-18 (×4): 300 mg via ORAL
  Filled 2018-11-16 (×4): qty 1

## 2018-11-16 MED ORDER — EPHEDRINE SULFATE-NACL 50-0.9 MG/10ML-% IV SOSY
PREFILLED_SYRINGE | INTRAVENOUS | Status: DC | PRN
  Administered 2018-11-16 (×3): 5 mg via INTRAVENOUS

## 2018-11-16 MED ORDER — LACTATED RINGERS IV SOLN
INTRAVENOUS | Status: DC
  Administered 2018-11-16: 07:00:00 via INTRAVENOUS

## 2018-11-16 MED ORDER — ONDANSETRON HCL 4 MG/2ML IJ SOLN
INTRAMUSCULAR | Status: AC
  Filled 2018-11-16: qty 2

## 2018-11-16 SURGICAL SUPPLY — 47 items
BENZOIN TINCTURE PRP APPL 2/3 (GAUZE/BANDAGES/DRESSINGS) IMPLANT
BLADE HEX COATED 2.75 (ELECTRODE) ×3 IMPLANT
BLADE SURG 15 STRL LF DISP TIS (BLADE) ×1 IMPLANT
BLADE SURG 15 STRL SS (BLADE) ×2
CHLORAPREP W/TINT 26ML (MISCELLANEOUS) ×3 IMPLANT
CLOSURE WOUND 1/2 X4 (GAUZE/BANDAGES/DRESSINGS)
COVER SURGICAL LIGHT HANDLE (MISCELLANEOUS) ×3 IMPLANT
COVER WAND RF STERILE (DRAPES) ×2 IMPLANT
DECANTER SPIKE VIAL GLASS SM (MISCELLANEOUS) ×3 IMPLANT
DERMABOND ADVANCED (GAUZE/BANDAGES/DRESSINGS) ×2
DERMABOND ADVANCED .7 DNX12 (GAUZE/BANDAGES/DRESSINGS) IMPLANT
DISSECTOR ROUND CHERRY 3/8 STR (MISCELLANEOUS) IMPLANT
DRAIN PENROSE 18X1/2 LTX STRL (DRAIN) IMPLANT
DRAPE LAPAROTOMY TRNSV 102X78 (DRAPE) ×3 IMPLANT
ELECT PENCIL ROCKER SW 15FT (MISCELLANEOUS) ×3 IMPLANT
ELECT REM PT RETURN 15FT ADLT (MISCELLANEOUS) ×3 IMPLANT
GAUZE SPONGE 4X4 12PLY STRL (GAUZE/BANDAGES/DRESSINGS) IMPLANT
GLOVE EUDERMIC 7 POWDERFREE (GLOVE) ×3 IMPLANT
GOWN STRL REUS W/TWL XL LVL3 (GOWN DISPOSABLE) ×6 IMPLANT
KIT BASIN OR (CUSTOM PROCEDURE TRAY) ×3 IMPLANT
MESH ULTRAPRO 3X6 7.6X15CM (Mesh General) ×2 IMPLANT
NDL HYPO 25X1 1.5 SAFETY (NEEDLE) ×1 IMPLANT
NEEDLE HYPO 25X1 1.5 SAFETY (NEEDLE) ×3 IMPLANT
PACK BASIC VI WITH GOWN DISP (CUSTOM PROCEDURE TRAY) ×3 IMPLANT
SPONGE LAP 4X18 RFD (DISPOSABLE) ×3 IMPLANT
STAPLER VISISTAT 35W (STAPLE) IMPLANT
STRIP CLOSURE SKIN 1/2X4 (GAUZE/BANDAGES/DRESSINGS) IMPLANT
SUT MNCRL AB 4-0 PS2 18 (SUTURE) ×3 IMPLANT
SUT NOVA NAB GS-21 0 18 T12 DT (SUTURE) IMPLANT
SUT PROLENE 2 0 CT2 30 (SUTURE) ×6 IMPLANT
SUT SILK 2 0 (SUTURE) ×2
SUT SILK 2 0 SH (SUTURE) IMPLANT
SUT SILK 2-0 18XBRD TIE 12 (SUTURE) IMPLANT
SUT VIC AB 2-0 SH 27 (SUTURE) ×6
SUT VIC AB 2-0 SH 27X BRD (SUTURE) ×1 IMPLANT
SUT VIC AB 3-0 54XBRD REEL (SUTURE) ×1 IMPLANT
SUT VIC AB 3-0 BRD 54 (SUTURE) ×2
SUT VIC AB 3-0 SH 27 (SUTURE) ×2
SUT VIC AB 3-0 SH 27XBRD (SUTURE) ×1 IMPLANT
SUT VICRYL 2 0 18  UND BR (SUTURE)
SUT VICRYL 2 0 18 UND BR (SUTURE) IMPLANT
SYR 20CC LL (SYRINGE) ×3 IMPLANT
SYR BULB IRRIGATION 50ML (SYRINGE) ×3 IMPLANT
TOWEL OR 17X26 10 PK STRL BLUE (TOWEL DISPOSABLE) ×3 IMPLANT
TOWEL OR NON WOVEN STRL DISP B (DISPOSABLE) ×3 IMPLANT
TRAY FOLEY MTR SLVR 16FR STAT (SET/KITS/TRAYS/PACK) IMPLANT
YANKAUER SUCT BULB TIP 10FT TU (MISCELLANEOUS) IMPLANT

## 2018-11-16 NOTE — Anesthesia Procedure Notes (Addendum)
Anesthesia Regional Block: TAP block   Pre-Anesthetic Checklist: ,, timeout performed, Correct Patient, Correct Site, Correct Laterality, Correct Procedure, Correct Position, site marked, Risks and benefits discussed,  Surgical consent,  Pre-op evaluation,  At surgeon's request and post-op pain management  Laterality: Right  Prep: chloraprep       Needles:   Needle Type: Stimiplex     Needle Length: 9cm      Additional Needles:   Procedures:,,,, ultrasound used (permanent image in chart),,,,  Narrative:  Start time: 11/16/2018 8:10 AM End time: 11/16/2018 8:15 AM Injection made incrementally with aspirations every 5 mL.  Performed by: Personally  Anesthesiologist: [name], MD  Additional Notes: Patient tolerated well. Good fascial spread noted.

## 2018-11-16 NOTE — Evaluation (Signed)
Physical Therapy Evaluation Patient Details Name: James Calhoun MRN: [id number] DOB: 1928/10/19 Today's Date: 11/16/2018   History of Present Illness  85 y.o. male admitted for R inguinal hernia repair. PMH of degenerative disc disease, spinal stenosis, hypertension, coronary coronary artery disease,  ventricular bigeminy , macular degeneration, colonoscopy 2009.  Cholecystectomy 2015.  Clinical Impression  Pt admitted with above diagnosis. Pt currently with functional limitations due to the deficits listed below (see PT Problem List). Pt ambulated 100' with RW and min/guard assist for safety. Pt's son reports that in the past 3 months pt has had 2 falls to the floor and was unable to get up. Pt lives alone and family checks in 2x/day to assist with meals and medication management. Son reports pt's memory is declining and feels pt is no longer safe to be home alone. Social work consult recommended.  Pt will benefit from skilled PT to increase their independence and safety with mobility to allow discharge to the venue listed below.       Follow Up Recommendations Supervision/Assistance - 24 hour;Supervision for mobility/OOB;SNF    Equipment Recommendations  None recommended by PT    Recommendations for Other Services       Precautions / Restrictions Precautions Precautions: Fall Precaution Comments: son reports pt has fallen to floor and not been able to get up 2x in past 3 months Restrictions Weight Bearing Restrictions: No      Mobility  Bed Mobility Overal bed mobility: Needs Assistance Bed Mobility: Rolling;Sidelying to Sit Rolling: Min assist Sidelying to sit: Min assist       General bed mobility comments: VCs for log roll technique  Transfers Overall transfer level: Needs assistance Equipment used: Rolling walker (2 wheeled) Transfers: Sit to/from Stand Sit to Stand: Min guard         General transfer comment: VCs hand placement  Ambulation/Gait Ambulation/Gait  assistance: Min guard Gait Distance (Feet): 100 Feet Assistive device: Rolling walker (2 wheeled) Gait Pattern/deviations: Shuffle;Trunk flexed Gait velocity: WFL   General Gait Details: shuffles at baseline per son  Stairs            Wheelchair Mobility    Modified Rankin (Stroke Patients Only)       Balance Overall balance assessment: Needs assistance;History of Falls   Sitting balance-Leahy Scale: Good       Standing balance-Leahy Scale: Fair                               Pertinent Vitals/Pain Pain Assessment: 0-10 Pain Score: 5  Pain Location: inguinal area surgical site Pain Descriptors / Indicators: Sore Pain Intervention(s): Limited activity within patient's tolerance;Monitored during session    Home Living Family/patient expects to be discharged to:: Private residence Living Arrangements: Alone Available Help at Discharge: Family;Available PRN/intermittently   Home Access: Stairs to enter   Entrance Stairs-Number of Steps: 1 Home Layout: One level Home Equipment: Walker - 2 wheels      Prior Function Level of Independence: Needs assistance   Gait / Transfers Assistance Needed: walks with RW, 2 falls in past 3 months  ADL's / Homemaking Assistance Needed: independent with sponge bathing  Comments: family provides meals, transportation, assist with medication management     Hand Dominance        Extremity/Trunk Assessment   Upper Extremity Assessment Upper Extremity Assessment: Overall WFL for tasks assessed    Lower Extremity Assessment Lower Extremity Assessment: Overall WFL for tasks  assessed    Cervical / Trunk Assessment Cervical / Trunk Assessment: Kyphotic  Communication   Communication: HOH(very HOH, pt refuses hearing aide)  Cognition Arousal/Alertness: Awake/alert Behavior During Therapy: WFL for tasks assessed/performed Overall Cognitive Status: History of cognitive impairments - at baseline                                  General Comments: son reports pt's short term memory has been declining in past 3 months, he requires assist to manage medication      General Comments      Exercises     Assessment/Plan    PT Assessment Patient needs continued PT services  PT Problem List Pain;Decreased mobility;Decreased activity tolerance;Decreased balance       PT Treatment Interventions DME instruction;Gait training;Functional mobility training;Balance training;Therapeutic activities;Patient/family education    PT Goals (Current goals can be found in the Care Plan section)  Acute Rehab PT Goals Patient Stated Goal: son feels pt isn't safe to be home alone but pt is resistent to SNF PT Goal Formulation: With family Time For Goal Achievement: 11/23/18 Potential to Achieve Goals: Fair    Frequency Min 2X/week   Barriers to discharge        Co-evaluation               AM-PAC PT "6 Clicks" Mobility  Outcome Measure Help needed turning from your back to your side while in a flat bed without using bedrails?: A Little Help needed moving from lying on your back to sitting on the side of a flat bed without using bedrails?: A Little Help needed moving to and from a bed to a chair (including a wheelchair)?: A Little Help needed standing up from a chair using your arms (e.g., wheelchair or bedside chair)?: A Little Help needed to walk in hospital room?: A Little Help needed climbing 3-5 steps with a railing? : A Lot 6 Click Score: 17    End of Session Equipment Utilized During Treatment: Gait belt Activity Tolerance: Patient tolerated treatment well Patient left: in chair;with call bell/phone within reach;with family/visitor present Nurse Communication: Mobility status PT Visit Diagnosis: History of falling (Z91.81);Difficulty in walking, not elsewhere classified (R26.2);Pain    Time: [id number] PT Time Calculation (min) (ACUTE ONLY): 27 min   Charges:   PT  Evaluation $PT Eval Low Complexity: 1 Low PT Treatments $Gait Training: 8-22 mins        Ralene BatheUhlenberg, Lyris Hitchman Kistler PT 11/16/2018  Acute Rehabilitation Services Pager [phone] Office [phone]

## 2018-11-16 NOTE — Op Note (Signed)
Patient Name:           James Calhoun   Date of Surgery:        11/16/2018  Pre op Diagnosis:      Large right inguinal hernia  Post op Diagnosis:    Same  Procedure:                 Open repair right inguinal hernia with mesh(Lichtenstein repair)  Surgeon:                     Angelia MouldHaywood M. Derrell LollingIngram, M.D., FACS  Assistant:                      OR staff  Operative Indications:   This is a pleasant but elderly and somewhat feeble 85 year old man, referred by Dr. Jarome Matinaniel Paterson for evaluation of a progressive symptomatic right inguinal hernia. He has been evaluated for cardiovascular disease by Dr. Rennis GoldenHilty in 2016. He has coronary artery disease but never had a heart attack.  Recent echocardiogram showed ejection fraction 60 to 65% he was thought to be acceptable risk.      He has had a right inguinal hernia for some time a perhaps 2 years. It has gotten bigger and become painful. No history of incarceration, abdominal pain nausea or vomiting. Appetite excellent. Bowel movements functional. Starting to get some memory problems.  His son is with him throughout the encounter today.       Past history significant for degenerative disc disease, spinal stenosis, hypertension, coronary coronary artery disease. He ambulates with a walker. Does not report any falls but is a little bit unstable. Has had ventricular bigeminy in the past has macular degeneration last colonoscopy 2009. Cholecystectomy 2015..      I found a large grapefruit sized inguinal hernia on the right side. No hernia on the left. With him supine and was able to reduce this after about 30 or 45 seconds. I agree with Dr. Eloise HarmanPaterson that it would be in his best interest to undergo elective surgery because I think he is heading for an incarceration or possible strangulation at some point. I think the risk of surgery is less than the risk of observation over time in this gentleman He agrees.      He'll be scheduled for open repair of  right inguinal hernia with mesh. I discussed the indications, details, techniques, numerous risk of the surgery with him and his son. He agrees with this plan.     He will be observed in the hospital overnight.  He is at increased risk for memory problems, voiding difficulty, bleeding.  If his mental status deteriorates he may need SNF placement.  Operative Findings:       He had a huge indirect right inguinal hernia.  Once he was asleep I was able to reduce this.  In the operating room when I opened the sac there were no contents.  The floor of the inguinal canal was weak but he did not have an obvious direct hernia.  His tissues bled a little bit more than usual but was very hemostatic at the end.  Standard Lichtenstein repair was accomplished.  Procedure in Detail:          The patient had a TAPP block preop.  He was taken the operating room and underwent general endotracheal anesthesia.  Lower abdomen and groins and genitalia were prepped and draped in a sterile fashion.  Intravenous antibiotics were given.  Surgical timeout was performed.  Exparel was used to supplement the TAPP block.      A transverse incision was made in the right groin overlying the inguinal canal.  Dissection was carried down through the subcutaneous tissues, exposing the aponeurosis of the external oblique.  The external oblique was incised in the direction of its fibers, opening up the external ring.  Self-retaining retractors were placed.  The cord structures were mobilized and encircled with a Penrose drain.  What followed was about a 30 -minute dissection slowly separating the cremasteric muscle fibers from the cord.  Resecting a small lipoma.  Isolating the indirect sac opening and inspecting it.  The indirect sac was then twisted and suture ligated at the level of the internal ring with a suture ligature of 2-0 Vicryl.  The redundant sac was excised.  Sensory nerve laterally was clamped divided and ligated with a silk tie.   The internal ring was tightened laterally with a figure-of-eight suture of 2-0 Vicryl.  The floor of the inguinal canal was repaired and reinforced with a 3 x 6 inch piece of ultra pro mesh.  The mesh was sutured in place with running sutures of 2-0 Prolene and  interrupted mattress sutures of 2-0 Prolene.  The mesh was sutured so as to generously overlap the fascia at the pubic tubercle, then along the inguinal ligament inferiorly.  Medially, superiorly, and superior laterally several mattress sutures of Prolene were placed.  The mesh was incised laterally so as to wrap around the cord structures at the internal ring and the tails of the mesh were overlapped laterally and further Prolene sutures placed.  This provided very secure repair and coverage both medial and lateral to the internal ring but allowed a small fingertip opening for the cord structures.     The wound was irrigated.  Hemostasis was excellent and had been achieved with electrocautery and Vicryl ties.  The external oblique was closed with a running suture of 2-0 Vicryl placing the cord structures deep to that.  Scarpa's fascia was closed with 3-0 Vicryl and the skin closed with a running subcuticular 4-0 Monocryl and Dermabond.  The patient was taken to the PACU in stable condition.  EBL 15 to 20 cc.  Counts correct.  Complications none.    Addendum: I logged onto the PMP aware website and reviewed his prescription medication history     Braileigh Landenberger M. Derrell Lolling, M.D., FACS General and Minimally Invasive Surgery Breast and Colorectal Surgery  11/16/2018 10:01 AM

## 2018-11-16 NOTE — Anesthesia Procedure Notes (Signed)
Procedure Name: Intubation Date/Time: 11/16/2018 8:33 AM Performed by: Anne Fu, CRNA Pre-anesthesia Checklist: Patient identified, Emergency Drugs available, Suction available, Patient being monitored and Timeout performed Patient Re-evaluated:Patient Re-evaluated prior to induction Oxygen Delivery Method: Circle system utilized Preoxygenation: Pre-oxygenation with 100% oxygen Induction Type: IV induction Ventilation: Mask ventilation without difficulty Laryngoscope Size: Mac and 4 Grade View: Grade I Tube type: Oral Tube size: 7.5 mm Number of attempts: 1 Airway Equipment and Method: Stylet Placement Confirmation: ETT inserted through vocal cords under direct vision,  positive ETCO2 and breath sounds checked- equal and bilateral Secured at: 21 cm Tube secured with: Tape Dental Injury: Teeth and Oropharynx as per pre-operative assessment

## 2018-11-16 NOTE — Discharge Instructions (Signed)
[facility] _______[facility]  INGUINAL HERNIA REPAIR: POST OP INSTRUCTIONS  Always review your discharge instruction sheet given to you by the facility where your surgery was performed. IF YOU HAVE DISABILITY OR FAMILY LEAVE FORMS, YOU MUST BRING THEM TO THE OFFICE FOR PROCESSING.   DO NOT GIVE THEM TO YOUR DOCTOR.  1. A  prescription for pain medication may be given to you upon discharge.  Take your pain medication as prescribed, if needed.  If narcotic pain medicine is not needed, then you may take acetaminophen (Tylenol) or ibuprofen (Advil) as needed. 2. Take your usually prescribed medications unless otherwise directed. If you need a refill on your pain medication, please contact your pharmacy.  They will contact our office to request authorization. Prescriptions will not be filled after 5 pm or on week-ends. 3. You should follow a light diet the first 24 hours after arrival home, such as soup and crackers, etc.  Be sure to include lots of fluids daily.  Resume your normal diet the day after surgery. 4.Most patients will experience some swelling and bruising in the groin and scrotum.  Ice packs and reclining will help.  Swelling and bruising can take several days to resolve.  6. It is common to experience some constipation if taking pain medication after surgery.  Increasing fluid intake and taking a stool softener (such as Colace) will usually help or prevent this problem from occurring.  A mild laxative (Milk of Magnesia or Miralax) should be taken according to package directions if there are no bowel movements after 48 hours. 7. Unless discharge instructions indicate otherwise, you may remove your bandages 24-48 hours after surgery, and you may shower at that time.  You may have steri-strips (small skin tapes) in place directly over the incision.  These strips should be left on the skin for 7-10 days.  If your surgeon used skin glue on the incision, you may shower in 24 hours.  The  glue will flake off over the next 2-3 weeks.  Any sutures or staples will be removed at the office during your follow-up visit. 8. ACTIVITIES:  You may resume regular (light) daily activities beginning the next day--such as daily self-care, walking, climbing stairs--gradually increasing activities as tolerated.  You may have sexual intercourse when it is comfortable.  Refrain from any heavy lifting or straining until approved by your doctor.   _____________________________________________  9.You should see your doctor in the office for a follow-up appointment approximately 2-3 weeks after your surgery.  Make sure that you call for this appointment within a day or two after you arrive home to insure a convenient appointment time. 10.OTHER INSTRUCTIONS: _________________________    _____________________________________  WHEN TO CALL YOUR DOCTOR: 1. Fever over 101.0 2. Inability to urinate 3. Nausea and/or vomiting 4. Extreme swelling or bruising 5. Continued bleeding from incision. 6. Increased pain, redness, or drainage from the incision  The clinic staff is available to answer your questions during regular business hours.  Please dont hesitate to call and ask to speak to one of the nurses for clinical concerns.  If you have a medical emergency, go to the nearest emergency room or call 911.  [facility] is always on call at the hospital   [address], [address], [place], [place]  [address] ?  [address] [address], [place], [place]   [id number] [phone] ? [phone] ? FAX [phone] Web site: [email or web address]

## 2018-11-16 NOTE — Transfer of Care (Signed)
Immediate Anesthesia Transfer of Care Note  Patient: James Calhoun  Procedure(s) Performed: Procedure(s) with comments: OPEN REPAIR RIGHT INGUINAL HERNIA WITH MESH (Right) - GENERAL AND TAP BLOCK  Patient Location: PACU  Anesthesia Type:General  Level of Consciousness:  sedated, patient cooperative and responds to stimulation  Airway & Oxygen Therapy:Patient Spontanous Breathing and Patient connected to face mask oxgen  Post-op Assessment:  Report given to PACU RN and Post -op Vital signs reviewed and stable  Post vital signs:  Reviewed and stable  Last Vitals:  Vitals:   11/16/18 0813 11/16/18 1003  BP:  (!) 159/68  Pulse: 66 75  Resp: (!) 9 13  Temp:  36.5 C  SpO2: 100% 100%    Complications: No apparent anesthesia complications

## 2018-11-16 NOTE — Progress Notes (Signed)
Assisted Dr. Germeroth with right, ultrasound guided, transabdominal plane block. Side rails up, monitors on throughout procedure. See vital signs in flow sheet. Tolerated Procedure well. 

## 2018-11-16 NOTE — Interval H&P Note (Signed)
History and Physical Interval Note:  11/16/2018 8:04 AM  James Calhoun  has presented today for surgery, with the diagnosis of RIGHT INGUINAL HERNIA  The various methods of treatment have been discussed with the patient and family. After consideration of risks, benefits and other options for treatment, the patient has consented to  Procedure(s) with comments: OPEN REPAIR RIGHT INGUINAL HERNIA WITH MESH (Right) - GENERAL AND TAP BLOCK as a surgical intervention .  The patient's history has been reviewed, patient examined, no change in status, stable for surgery.  I have reviewed the patient's chart and labs.  Questions were answered to the patient's satisfaction.     Ernestene Mention

## 2018-11-17 ENCOUNTER — Encounter (HOSPITAL_COMMUNITY): Payer: Self-pay | Admitting: General Surgery

## 2018-11-17 DIAGNOSIS — K4090 Unilateral inguinal hernia, without obstruction or gangrene, not specified as recurrent: Secondary | ICD-10-CM | POA: Diagnosis not present

## 2018-11-17 LAB — CBC
HCT: 35.2 % — ABNORMAL LOW (ref 39.0–52.0)
Hemoglobin: 11.2 g/dL — ABNORMAL LOW (ref 13.0–17.0)
MCH: 31.2 pg (ref 26.0–34.0)
MCHC: 31.8 g/dL (ref 30.0–36.0)
MCV: 98.1 fL (ref 80.0–100.0)
Platelets: 173 10*3/uL (ref 150–400)
RBC: 3.59 MIL/uL — ABNORMAL LOW (ref 4.22–5.81)
RDW: 14.1 % (ref 11.5–15.5)
WBC: 15 10*3/uL — ABNORMAL HIGH (ref 4.0–10.5)
nRBC: 0 % (ref 0.0–0.2)

## 2018-11-17 LAB — BASIC METABOLIC PANEL
Anion gap: 8 (ref 5–15)
BUN: 22 mg/dL (ref 8–23)
CO2: 27 mmol/L (ref 22–32)
Calcium: 9 mg/dL (ref 8.9–10.3)
Chloride: 105 mmol/L (ref 98–111)
Creatinine, Ser: 1.17 mg/dL (ref 0.61–1.24)
GFR calc Af Amer: >60 mL/min (ref >60)
GFR calc non Af Amer: 55 mL/min — ABNORMAL LOW (ref >60)
GLUCOSE: 139 mg/dL — AB (ref 70–99)
Potassium: 4 mmol/L (ref 3.5–5.1)
Sodium: 140 mmol/L (ref 135–145)

## 2018-11-17 MED ORDER — ENOXAPARIN SODIUM 40 MG/0.4ML ~~LOC~~ SOLN
40.0000 mg | SUBCUTANEOUS | Status: DC
  Administered 2018-11-17 – 2018-11-18 (×2): 40 mg via SUBCUTANEOUS
  Filled 2018-11-17 (×2): qty 0.4

## 2018-11-17 MED ORDER — ACETAMINOPHEN 325 MG PO TABS: 650.0000 mg | ORAL_TABLET | Freq: Four times a day (QID) | ORAL | Status: DC | PRN

## 2018-11-17 NOTE — Clinical Social Work Note (Signed)
Clinical Social Work Assessment  Patient Details  Name: James Calhoun MRN: [id number] Date of Birth: 1928/09/23  Date of referral:  11/17/18               Reason for consult:  Facility Placement, Discharge Planning                Permission sought to share information with:  Family Supports Permission granted to share information::  Yes, Verbal Permission Granted  Name::     Hollie Bartus  Agency::  snf  Relationship::  son  Contact Information:  [phone]  Housing/Transportation Living arrangements for the past 2 months:  [place] of Information:  Patient, Adult Children Patient Interpreter Needed:  None Criminal Activity/Legal Involvement Pertinent to Current Situation/Hospitalization:  No - Comment as needed Significant Relationships:  Adult Children, Other Family Members Lives with:  Self Do you feel safe going back to the place where you live?  Yes Need for family participation in patient care:  Yes (Comment)  Care giving concerns:  Patient has son Dominica Severin at bedside. Patient is extremely hard of hearing. Dominica Severin stated that patient live at home alone. Dominica Severin stated that either he, his wife, niece of patients neighbors checks in on patient everyday.   Social Worker assessment / plan:  CSW met family at bedside. Dominica Severin stated that patient has started declining over the past couple of months. Dominica Severin stated that during the day he would have patient call him in the morning to make sure patient was okay but due to his hearing patient and Dominica Severin would have a hard time holding conversations. Dominica Severin stated that patient has also started forgetting things and he feels that patient is needing more supervision during the day. Dominica Severin stated that due to his work schedule it would be difficult for him to be with patient everyday. Both patient and Dominica Severin are agreeable for patient to go to SNF for rehab. CSW went over the process of SNF and stated to family CSW will follow up once bed offers have been  given.  Employment status:  Retired Nurse, adult PT Recommendations:  [place] / Referral to community resources:  [place]  Patient/Family's Response to care:  Family appreciates CSW role in care  Patient/Family's Understanding of and Emotional Response to Diagnosis, Current Treatment, and Prognosis:  Family agreeable with discharge plans   Emotional Assessment Appearance:  Appears stated age Attitude/Demeanor/Rapport:  Engaged Affect (typically observed):  Accepting Orientation:  Oriented to Self, Oriented to Place, Oriented to  Time, Oriented to Situation Alcohol / Substance use:  Not Applicable Psych involvement (Current and /or in the community):  No (Comment)  Discharge Needs  Concerns to be addressed:  Care Coordination Readmission within the last 30 days:  No Current discharge risk:  Dependent with Mobility Barriers to Discharge:  S[employer] broker, Continued Medical Work up   [employer], LCSW 11/17/2018, 11:13 AM

## 2018-11-17 NOTE — Progress Notes (Signed)
Physical Therapy Treatment Patient Details Name: James Calhoun MRN: [id number] DOB: 02/13/1928 Today's Date: 11/17/2018    History of Present Illness 85 y.o. male admitted for R inguinal hernia repair. PMH of degenerative disc disease, spinal stenosis, hypertension, coronary coronary artery disease,  ventricular bigeminy , macular degeneration, colonoscopy 2009.  Cholecystectomy 2015.    PT Comments    Patient seen for mobility progression. Patient tolerable to all mobility with only stating mild soreness at surgical site. Patient requires Min guard throughout for safety with verbal cueing. Making good progress towards goals. Will continue to recommend short term rehab to progress safety and independence with functional mobility prior to d/c.     Follow Up Recommendations  Supervision/Assistance - 24 hour;Supervision for mobility/OOB;SNF     Equipment Recommendations  None recommended by PT    Recommendations for Other Services       Precautions / Restrictions Precautions Precautions: Fall Precaution Comments: son reports pt has fallen to floor and not been able to get up 2x in past 3 months Restrictions Weight Bearing Restrictions: No    Mobility  Bed Mobility               General bed mobility comments: up in recliner  Transfers Overall transfer level: Needs assistance Equipment used: Rolling walker (2 wheeled) Transfers: Sit to/from Stand Sit to Stand: Min guard         General transfer comment: patient received slumped down in recliner - ablet o use B UE/LE to reposition; min guard for safety - wanted to do things in his own manner  Ambulation/Gait Ambulation/Gait assistance: Min guard Gait Distance (Feet): 100 Feet(2 reps with 1 standing rest break) Assistive device: Rolling walker (2 wheeled) Gait Pattern/deviations: Shuffle;Trunk flexed Gait velocity: WFL   General Gait Details: shuffles at baseline per son   Stairs             Wheelchair  Mobility    Modified Rankin (Stroke Patients Only)       Balance Overall balance assessment: Needs assistance;History of Falls Sitting-balance support: Feet supported Sitting balance-Leahy Scale: Good     Standing balance support: Bilateral upper extremity supported;During functional activity;No upper extremity supported Standing balance-Leahy Scale: Fair Standing balance comment: close minguard for static standing; overall reliant on UE support                            Cognition Arousal/Alertness: Awake/alert Behavior During Therapy: WFL for tasks assessed/performed Overall Cognitive Status: History of cognitive impairments - at baseline                                 General Comments: per previous chart review pt's son reporting decline in STM over past 3 months; difficult to fully assess due to pt very [facility]      Exercises General Exercises - Lower Extremity Ankle Circles/Pumps: AROM;Both;10 reps;Seated Long Arc Quad: AROM;Both;5 reps;Seated Mini-Sqauts: (sit to stand from recliner x 15 reps)    General Comments General comments (skin integrity, edema, etc.): son and granddaughter present       Pertinent Vitals/Pain Pain Assessment: Faces Faces Pain Scale: Hurts little more Pain Location: inguinal area surgical site Pain Descriptors / Indicators: Sore Pain Intervention(s): Limited activity within patient's tolerance;Monitored during session;Repositioned    Home Living  Prior Function            PT Goals (current goals can now be found in the care plan section) Acute Rehab PT Goals Patient Stated Goal: to return home PT Goal Formulation: With family Time For Goal Achievement: 11/23/18 Potential to Achieve Goals: Fair Progress towards PT goals: Progressing toward goals    Frequency    Min 2X/week      PT Plan Current plan remains appropriate    Co-evaluation              AM-PAC PT "6  Clicks" Mobility   Outcome Measure  Help needed turning from your back to your side while in a flat bed without using bedrails?: A Little Help needed moving from lying on your back to sitting on the side of a flat bed without using bedrails?: A Little Help needed moving to and from a bed to a chair (including a wheelchair)?: A Little Help needed standing up from a chair using your arms (e.g., wheelchair or bedside chair)?: A Little Help needed to walk in hospital room?: A Little Help needed climbing 3-5 steps with a railing? : A Lot 6 Click Score: 17    End of Session Equipment Utilized During Treatment: Gait belt Activity Tolerance: Patient tolerated treatment well Patient left: in chair;with call bell/phone within reach;with family/visitor present Nurse Communication: Mobility status PT Visit Diagnosis: History of falling (Z91.81);Difficulty in walking, not elsewhere classified (R26.2);Pain     Time: [id number] PT Time Calculation (min) (ACUTE ONLY): 15 min  Charges:  $Gait Training: 8-22 mins                      Kipp LaurenceStephanie R Aaron, PT, DPT Supplemental Physical Therapist 11/17/18 3:16 PM Pager: [phone] Office: [phone]

## 2018-11-17 NOTE — Progress Notes (Signed)
The patient wanted to try urinating once more before we placed the foley.  Voided a moderate amount but, on bladder scan was found to have 620 cc residual urine.  Will place foley catheter per MD order.

## 2018-11-17 NOTE — Progress Notes (Signed)
Patient voided x2, a significant amount.  Post-void bladder scan showed 498 cc.  Dr. Gerrit Friends notified.

## 2018-11-17 NOTE — Progress Notes (Signed)
ANTICOAGULATION CONSULT NOTE - Initial Consult  Pharmacy Consult for LMWH Indication: VTE prophylaxis  No Known Allergies  Patient Measurements: Height: 5\' 4"  (162.6 cm) Weight: 154 lb (69.9 kg) IBW/kg (Calculated) : 59.2 Heparin Dosing Weight:   Vital Signs: Temp: 97.6 F (36.4 C) (01/30 0612) Temp Source: Oral (01/30 0612) BP: 133/68 (01/30 0612) Pulse Rate: 80 (01/30 0612)  Labs: Recent Labs    11/17/18 0446  HGB 11.2*  HCT 35.2*  PLT 173  CREATININE 1.17    Estimated Creatinine Clearance: 36.5 mL/min (by C-G formula based on SCr of 1.17 mg/dL).   Medical History: Past Medical History:  Diagnosis Date  . Arthritis   . Bigeminal rhythm    bigeminal PVCs ; see prior EKGs   . ED (erectile dysfunction)   . GERD (gastroesophageal reflux disease)   . Hip pain   . History of ischemic cardiomyopathy   . HOH (hard of hearing)   . Hypertension   . Low back pain    spinal stenosis   . MI (myocardial infarction) [facility])    see LOV cardiology Dr Rennis Golden epic 04-02-15; notes that patient had stress test and [facility] that "suggest he had a prior inferior MI"; patient and son say they are unaware of this   . Osteoporosis   . Thin skin    bruises easy    Medications:  Medications Prior to Admission  Medication Sig Dispense Refill Last Dose  . aspirin 81 MG tablet Take 81 mg by mouth daily.   Past Week at Unknown time  . furosemide (LASIX) 20 MG tablet Take 20 mg by mouth daily.   11/15/2018 at Unknown time  . gabapentin (NEURONTIN) 300 MG capsule Take 300 mg by mouth at bedtime.  1 11/15/2018 at Unknown time  . losartan (COZAAR) 50 MG tablet Take 50 mg by mouth daily.     . metoprolol succinate (TOPROL-XL) 25 MG 24 hr tablet Take 25 mg by mouth daily.    11/16/2018 at 0400  . traMADol (ULTRAM) 50 MG tablet Take 50 mg by mouth 3 (three) times daily as needed for moderate pain.    11/15/2018 at Unknown time  . verapamil (CALAN-SR) 240 MG CR tablet Take 240 mg by mouth at bedtime.    11/15/2018 at Unknown time    Assessment: 85yo M s/p hernia repair. Pharmacy to dose LMWH for VTE px   Plan:  LMWH 40 qday Pharmacy to sign off  Herby Abraham, Pharm.D [phone] 11/17/2018 7:30 AM

## 2018-11-17 NOTE — Plan of Care (Signed)
Plan of care reviewed and discussed with the patient and family. Questions answered.

## 2018-11-17 NOTE — Progress Notes (Signed)
Bladder scan performed, found 519 mls the first time and 493 mls the second try.

## 2018-11-17 NOTE — Progress Notes (Signed)
Spoke with MD concerning low bp, orders given.

## 2018-11-17 NOTE — Evaluation (Signed)
Occupational Therapy Evaluation Patient Details Name: James Calhoun MRN: [id number] DOB: [birth year] Today's Date: 11/17/2018    History of Present Illness 85 y.o. male admitted for R inguinal hernia repair. PMH of degenerative disc disease, spinal stenosis, hypertension, coronary coronary artery disease,  ventricular bigeminy , macular degeneration, colonoscopy 2009.  Cholecystectomy 2015.   Clinical Impression   This 85 y/o male presents with the above. At baseline pt is mod independent with ADL and functional mobility mostly using RW; pt reports receiving some assist from son for ADL just prior to surgery, was mostly performing sponge bathing at home; pt lives alone with son checking in daily. Pt presenting with generalized weakness and decreased activity tolerance. Pt completed room level functional mobility using RW with minA today; he currently requires mingaurd assist for seated UB ADL, modA for LB ADL. Pt very pleasant and motivated to return to PLOF. Given pt's current functional status and that he lives alone, feel he will benefit from continued OT services in SNF setting prior to return home to maximize his safety and independence with ADL and mobility. Will continue to follow acutely to progress pt towards established OT goals.      Follow Up Recommendations  SNF;Supervision/Assistance - 24 hour    Equipment Recommendations  3 in 1 bedside commode;Other (comment)(TBD in next venue)           Precautions / Restrictions Precautions Precautions: Fall Precaution Comments: son reports pt has fallen to floor and not been able to get up 2x in past 3 months Restrictions Weight Bearing Restrictions: No      Mobility Bed Mobility Overal bed mobility: Needs Assistance Bed Mobility: Supine to Sit     Supine to sit: Min guard;HOB elevated     General bed mobility comments: minguard for safety, increased time to perform, pt request to perform transfer on his own without assist    Transfers Overall transfer level: Needs assistance Equipment used: Rolling walker (2 wheeled) Transfers: Sit to/from Stand Sit to Stand: Min guard         General transfer comment: VCs hand placement; minguard for safety and immediate standing balance    Balance Overall balance assessment: Needs assistance;History of Falls Sitting-balance support: Feet supported Sitting balance-Leahy Scale: Good     Standing balance support: Bilateral upper extremity supported;During functional activity;No upper extremity supported Standing balance-Leahy Scale: Fair Standing balance comment: close minguard for static standing; overall reliant on UE support                           ADL either performed or assessed with clinical judgement   ADL Overall ADL's : Needs assistance/impaired Eating/Feeding: Modified independent;Sitting   Grooming: Minimal assistance;Standing;Wash/dry hands   Upper Body Bathing: Min guard;Sitting   Lower Body Bathing: Minimal assistance;Sit to/from stand Lower Body Bathing Details (indicate cue type and reason): assist to wash LEs this session Upper Body Dressing : Min guard;Set up;Sitting   Lower Body Dressing: Moderate assistance;Sit to/from stand Lower Body Dressing Details (indicate cue type and reason): pt able to doff socks, provided some assist to don new socks as task is very effortful for pt to perform; minA standing balance Toilet Transfer: Minimal assistance;Ambulation;RW Toilet Transfer Details (indicate cue type and reason): simulated in transfer to recliner, room level mobility Toileting- Clothing Manipulation and Hygiene: Minimal assistance;Sit to/from stand Toileting - Clothing Manipulation Details (indicate cue type and reason): pt currently with condom cath which started leaking while standing at  sink; NT notified to reapply      Functional mobility during ADLs: Minimal assistance;Rolling walker General ADL Comments: pt with short  shuffling gait; presents with generalized weakness and decreased activity tolerance     Vision Baseline Vision/History: Wears glasses Wears Glasses: Reading only       Perception     Praxis      Pertinent Vitals/Pain Pain Assessment: 0-10 Pain Score: 4  Pain Location: inguinal area surgical site Pain Descriptors / Indicators: Sore Pain Intervention(s): Monitored during session;Limited activity within patient's tolerance;RN gave pain meds during session     Hand Dominance     Extremity/Trunk Assessment Upper Extremity Assessment Upper Extremity Assessment: Generalized weakness   Lower Extremity Assessment Lower Extremity Assessment: Defer to PT evaluation   Cervical / Trunk Assessment Cervical / Trunk Assessment: Kyphotic   Communication Communication Communication: HOH(very HOH, pt refuses hearing aide)   Cognition Arousal/Alertness: Awake/alert Behavior During Therapy: WFL for tasks assessed/performed Overall Cognitive Status: History of cognitive impairments - at baseline                                 General Comments: per previous chart review pt's son reporting decline in STM over past 3 months; difficult to fully assess due to pt very HOH   General Comments  son and friend present during session    Exercises     Shoulder Instructions      Home Living Family/patient expects to be discharged to:: Private residence Living Arrangements: Alone Available Help at Discharge: Family;Available PRN/intermittently   Home Access: Stairs to enter Entrance Stairs-Number of Steps: 1   Home Layout: One level     Bathroom Shower/Tub: Other (comment)(has been sponge bathing)         Home Equipment: Walker - 2 wheels;Cane - single point          Prior Functioning/Environment Level of Independence: Needs assistance  Gait / Transfers Assistance Needed: walks with RW, 2 falls in past 3 months ADL's / Homemaking Assistance Needed: independent with  sponge bathing   Comments: family provides meals, transportation, assist with medication management        OT Problem List: Decreased strength;Decreased range of motion;Decreased activity tolerance;Impaired balance (sitting and/or standing);Decreased cognition;Pain      OT Treatment/Interventions: Self-care/ADL training;Therapeutic exercise;DME and/or AE instruction;Therapeutic activities;Patient/family education;Balance training    OT Goals(Current goals can be found in the care plan section) Acute Rehab OT Goals Patient Stated Goal: to return home OT Goal Formulation: With patient Time For Goal Achievement: 12/01/18 Potential to Achieve Goals: Good  OT Frequency: Min 2X/week   Barriers to D/C:            Co-evaluation              AM-PAC OT "6 Clicks" Daily Activity     Outcome Measure Help from another person eating meals?: None Help from another person taking care of personal grooming?: A Little Help from another person toileting, which includes using toliet, bedpan, or urinal?: A Lot Help from another person bathing (including washing, rinsing, drying)?: A Lot Help from another person to put on and taking off regular upper body clothing?: A Little Help from another person to put on and taking off regular lower body clothing?: A Lot 6 Click Score: 16   End of Session Equipment Utilized During Treatment: Gait belt;Rolling walker Nurse Communication: Mobility status  Activity Tolerance: Patient tolerated treatment well  Patient left: in chair;with call bell/phone within reach;with family/visitor present  OT Visit Diagnosis: Muscle weakness (generalized) (M62.81);History of falling (Z91.81)                Time: 3235-5732 OT Time Calculation (min): 34 min Charges:  OT General Charges $OT Visit: 1 Visit OT Evaluation $OT Eval Moderate Complexity: 1 Mod OT Treatments $Self Care/Home Management : 8-22 mins  Marcy Siren, OT Supplemental Rehabilitation  Services Pager [phone] Office [phone]  Orlando Penner 11/17/2018, 9:44 AM

## 2018-11-17 NOTE — [place] FL2 (Signed)
[place] MEDICAID FL2 LEVEL OF CARE SCREENING TOOL     IDENTIFICATION  Patient Name: James Calhoun Birthdate: 1928/01/07 Sex: male Admission Date (Current Location): 11/16/2018  [facility] and [place] Number:  Producer, television/film/video and Address:  [facility],  [address] [place], [place] [address]      Provider Number: [id number]  Attending Physician Name and Address:  Claud Kelp, MD  Relative Name and Phone Number:  Kaiyan Laing, [phone]    Current Level of Care: Hospital Recommended Level of Care: Skilled Nursing Facility Prior Approval Number:    Date Approved/Denied:   PASRR Number: [id number]  Discharge Plan: SNF    Current Diagnoses: Patient Active Problem List   Diagnosis Date Noted  . Right inguinal hernia 11/16/2018  . Fatigue 04/02/2015  . History of acute inferior wall MI 04/12/2014  . Ischemic cardiomyopathy 04/12/2014  . Ventricular bigeminy 03/21/2014  . Preop cardiovascular exam 03/21/2014  . HTN (hypertension) 03/21/2014  . Symptomatic cholelithiasis 03/06/2014    Orientation RESPIRATION BLADDER Height & Weight     Self, Time, Situation, Place  Normal Incontinent Weight: 154 lb (69.9 kg) Height:  5\' 4"  (162.6 cm)  BEHAVIORAL SYMPTOMS/MOOD NEUROLOGICAL BOWEL NUTRITION STATUS      Continent Diet(heart healthy)  AMBULATORY STATUS COMMUNICATION OF NEEDS Skin   Limited Assist Verbally Surgical wounds(right side)                       Personal Care Assistance Level of Assistance  Bathing, Feeding, Dressing Bathing Assistance: Limited assistance Feeding assistance: Independent Dressing Assistance: Limited assistance     Functional Limitations Info  Sight, Hearing, Speech Sight Info: Adequate Hearing Info: Impaired(hard of hearing) Speech Info: Adequate    SPECIAL CARE FACTORS FREQUENCY  PT (By licensed PT), OT (By licensed OT)     PT Frequency: 5x wk OT Frequency: 5x wk            Contractures Contractures  Info: Not present    Additional Factors Info  Code Status, Allergies Code Status Info: Full Code Allergies Info: NKA           Current Medications (11/17/2018):  This is the current hospital active medication list Current Facility-Administered Medications  Medication Dose Route Frequency Provider Last Rate Last Dose  . acetaminophen (TYLENOL) tablet 650 mg  650 mg Oral Q6H PRN Claud Kelp, MD      . enoxaparin (LOVENOX) injection 40 mg  40 mg Subcutaneous Q24H Len Childs T, RPH   40 mg at 11/17/18 0854  . furosemide (LASIX) tablet 20 mg  20 mg Oral Daily Claud Kelp, MD      . gabapentin (NEURONTIN) capsule 300 mg  300 mg Oral QHS Claud Kelp, MD   300 mg at 11/16/18 2225  . gabapentin (NEURONTIN) capsule 300 mg  300 mg Oral BID Claud Kelp, MD   300 mg at 11/17/18 3154  . HYDROcodone-acetaminophen (NORCO/VICODIN) 5-325 MG per tablet 1-2 tablet  1-2 tablet Oral Q4H PRN Claud Kelp, MD      . lactated ringers infusion   Intravenous Continuous Claud Kelp, MD 10 mL/hr at 11/17/18 [phone]    . losartan (COZAAR) tablet 50 mg  50 mg Oral Daily Claud Kelp, MD      . metoprolol succinate (TOPROL-XL) 24 hr tablet 25 mg  25 mg Oral Daily Claud Kelp, MD      . ondansetron (ZOFRAN-ODT) disintegrating tablet 4 mg  4 mg Oral Q6H PRN Claud Kelp, MD  Or  . ondansetron (ZOFRAN) injection 4 mg  4 mg Intravenous Q6H PRN Claud Kelp, MD      . senna [facility]) tablet 8.6 mg  1 tablet Oral BID Claud Kelp, MD   8.6 mg at 11/17/18 0956  . traMADol (ULTRAM) tablet 50 mg  50 mg Oral Q6H PRN Claud Kelp, MD   50 mg at 11/17/18 0853  . verapamil (CALAN-SR) CR tablet 240 mg  240 mg Oral QHS Claud Kelp, MD   240 mg at 11/16/18 2225     Discharge Medications: Please see discharge summary for a list of discharge medications.  Relevant Imaging Results:  Relevant Lab Results:   Additional Information SS#[id number]  Althea Charon, LCSW

## 2018-11-17 NOTE — Progress Notes (Signed)
1 Day Post-Op  Subjective: Has had a relatively quiet night Son is present at bedside. One episode of pain but quite comfortable now Tolerating diet without nausea Voiding with condom cath To check PVR by ultrasound  Wound looks fine.  No bleeding  Has been seen by physical therapy.  Fall risk concern.  SNF recommended Await consult from OT, social work, case management  Long discussion with patient and son.  They are willing to do what ever is the safest thing and I think that may be temporary SNF/rehab placement and hopefully transition back home with in-home care given his advanced age and febrile state.  Objective: Vital signs in last 24 hours: Temp:  [97.4 F (36.3 C)-98.4 F (36.9 C)] 97.6 F (36.4 C) (01/30 0612) Pulse Rate:  [55-91] 80 (01/30 0612) Resp:  [8-18] 18 (01/30 0612) BP: (133-183)/(61-98) 133/68 (01/30 0612) SpO2:  [93 %-100 %] 95 % (01/30 0612) Last BM Date: 11/14/18  Intake/Output from previous day: 01/29 0701 - 01/30 0700 In: 1980.9 [P.O.:120; I.V.:1760.9; IV Piggyback:100] Out: 450 [Urine:425; Blood:25] Intake/Output this shift: No intake/output data recorded.   PE: General appearance: Quite alert and comfortable.  Carries on good conversation.  No distress.  Patient has  good insight into his condition and decision making about disposition and is now much more open minded than he was earlier. Resp: clear to auscultation bilaterally GI: soft, non-tender; bowel sounds normal; no masses,  no organomegaly Male genitalia: normal, Right groin incision looks very good.  No swelling or hematoma.  Small ecchymoses.  Penis scrotum and testes normal.  No sign of bleeding  Lab Results:  Results for orders placed or performed during the hospital encounter of 11/16/18 (from the past 24 hour(s))  Basic metabolic panel     Status: Abnormal   Collection Time: 11/17/18  4:46 AM  Result Value Ref Range   Sodium 140 135 - 145 mmol/L   Potassium 4.0 3.5 - 5.1 mmol/L    Chloride 105 98 - 111 mmol/L   CO2 27 22 - 32 mmol/L   Glucose, Bld 139 (H) 70 - 99 mg/dL   BUN 22 8 - 23 mg/dL   Creatinine, Ser 7.85 0.61 - 1.24 mg/dL   Calcium 9.0 8.9 - 88.5 mg/dL   GFR calc non Af Amer 55 (L) >60 mL/min   GFR calc Af Amer >60 >60 mL/min   Anion gap 8 5 - 15  CBC     Status: Abnormal   Collection Time: 11/17/18  4:46 AM  Result Value Ref Range   WBC 15.0 (H) 4.0 - 10.5 K/uL   RBC 3.59 (L) 4.22 - 5.81 MIL/uL   Hemoglobin 11.2 (L) 13.0 - 17.0 g/dL   HCT 02.7 (L) 74.1 - 28.7 %   MCV 98.1 80.0 - 100.0 fL   MCH 31.2 26.0 - 34.0 pg   MCHC 31.8 30.0 - 36.0 g/dL   RDW 86.7 67.2 - 09.4 %   Platelets 173 150 - 400 K/uL   nRBC 0.0 0.0 - 0.2 %     Studies/Results: No results found.  . furosemide  20 mg Oral Daily  . gabapentin  300 mg Oral QHS  . gabapentin  300 mg Oral BID  . losartan  50 mg Oral Daily  . metoprolol succinate  25 mg Oral Daily  . senna  1 tablet Oral BID  . verapamil  240 mg Oral QHS     Assessment/Plan: s/p Procedure(s): OPEN REPAIR RIGHT INGUINAL HERNIA WITH MESH  POD #1.  Open repair large indirect right inguinal hernia with mesh Doing well from surgical standpoint Advance diet IV  TKO Up to chair May ambulate with walker if deemed safe from PT standpoint  Chronic urinary incontinence.  Condom cath.  Good urine output.  Check bladder scan  Hypertension Coronary artery disease Spinal stenosis  VTE prophylaxis.  Since no bleeding we will start low-dose Lovenox per pharmacy consult  Disposition.  Son is advocating temporary SNF and rehab center.  Patient is now willing to consider this PT has consulted OT consult pending Social work consult pending Case management consult pending  He meets discharge criteria for transfer to SNF at this time. All of these issues have been discussed with bedside RN this morning.   @PROBHOSP @  LOS: 0 days    Ernestene Mention 11/17/2018  . .prob

## 2018-11-17 NOTE — Progress Notes (Signed)
Order received for foley catheter due to acute urinary retention.

## 2018-11-17 NOTE — Anesthesia Postprocedure Evaluation (Signed)
Anesthesia Post Note  Patient: James Calhoun  Procedure(s) Performed: OPEN REPAIR RIGHT INGUINAL HERNIA WITH MESH (Right )     Patient location during evaluation: PACU Anesthesia Type: General Level of consciousness: sedated and patient cooperative Pain management: pain level controlled Vital Signs Assessment: post-procedure vital signs reviewed and stable Respiratory status: spontaneous breathing Cardiovascular status: stable Anesthetic complications: no    Last Vitals:  Vitals:   11/17/18 0934 11/17/18 0946  BP: (!) 104/57 (!) 109/57  Pulse: 60 (!) 57  Resp: 18   Temp: 36.8 C   SpO2: 97%     Last Pain:  Vitals:   11/17/18 0934  TempSrc: Oral  PainSc:                  [name]

## 2018-11-18 ENCOUNTER — Encounter (HOSPITAL_COMMUNITY): Payer: Self-pay | Admitting: General Surgery

## 2018-11-18 DIAGNOSIS — K4090 Unilateral inguinal hernia, without obstruction or gangrene, not specified as recurrent: Secondary | ICD-10-CM | POA: Diagnosis not present

## 2018-11-18 DIAGNOSIS — R339 Retention of urine, unspecified: Secondary | ICD-10-CM

## 2018-11-18 HISTORY — DX: Retention of urine, unspecified: R33.9

## 2018-11-18 NOTE — Progress Notes (Signed)
Clinical Social Worker facilitated patient discharge including contacting patient family and facility to confirm patient discharge plans.  Clinical information faxed to facility and family agreeable with plan.  CSW arranged ambulance transport via [facility] to [facility] .  RN to call [phone] (pt will go in rm# [phone]) for report prior to discharge.  Clinical Social Worker will sign off for now as social work intervention is no longer needed. Please consult [place] again if new need arises.  Marrianne Mood, MSW, [employer] [phone]

## 2018-11-18 NOTE — Plan of Care (Signed)
Plan of care reviewed and discussed with the patient. 

## 2018-11-18 NOTE — Clinical Social Work Placement (Signed)
   CLINICAL SOCIAL WORK PLACEMENT  NOTE  Date:  11/18/2018  Patient Details  Name: James Calhoun MRN: [id number] Date of Birth: [birth year]  Clinical Social Work is seeking post-discharge placement for this patient at the Skilled  Nursing Facility level of care (*CSW will initial, date and re-position this form in  chart as items are completed):  Yes   Patient/family provided with [facility] Clinical Social Work Department's list of facilities offering this level of care within the geographic area requested by the patient (or if unable, by the patient's family).  Yes   Patient/family informed of their freedom to choose among providers that offer the needed level of care, that participate in Medicare, Medicaid or managed care program needed by the patient, have an available bed and are willing to accept the patient.  Yes   Patient/family informed of [facility]'s ownership interest in [facility] and [facility], as well as of the fact that they are under no obligation to receive care at these facilities.  PASRR submitted to EDS on       PASRR number received on       Existing PASRR number confirmed on 11/18/18     FL2 transmitted to all facilities in geographic area requested by pt/family on 11/18/18     FL2 transmitted to all facilities within larger geographic area on       Patient informed that his/her managed care company has contracts with or will negotiate with certain facilities, including the following:            Patient/family informed of bed offers received.  Patient chooses bed at [facility]     Physician recommends and patient chooses bed at      Patient to be transferred to ([facility]) on 11/18/18.  Patient to be transferred to facility by [facility]     Patient family notified on 11/18/18 of transfer.  Name of family member notified:  spoke with son (gary)     PHYSICIAN       Additional Comment:     _______________________________________________ Althea Charon, LCSW 11/18/2018, 12:40 PM

## 2018-11-18 NOTE — Discharge Summary (Addendum)
Patient ID: James Calhoun [id number] 85 y.o. 13-Mar-1928  Admit date: 11/16/2018  Discharge date and time: 11/18/2018  Admitting Physician: James Calhoun  Discharge Physician: James Calhoun  Admission Diagnoses: RIGHT INGUINAL HERNIA  Discharge Diagnoses: Right inguinal hernia                                         Postoperative urinary retention                                         Spinal stenosis, lumbosacral region                                         Coronary artery disease, occlusive                                         Hypertension, essential, benign  Operations: Procedure(s): OPEN REPAIR RIGHT INGUINAL HERNIA WITH MESH  Admission Condition: fair  Discharged Condition: fair  Indication for Admission: This is a pleasant but elderly and somewhat feeble 85 year old man, referred by Dr. Jarome Matinaniel Calhoun for evaluation of a progressive symptomatic right inguinal hernia. He has been evaluated for cardiovascular disease by Dr. Rennis Calhoun in 2016. He has coronary artery disease but never had a heart attack.  Recent echocardiogram showed ejection fraction 60 to 65% he was thought to be acceptable risk. He has had a right inguinal hernia for some time a perhaps 2 years. It has gotten bigger and become painful. No history of incarceration, abdominal pain nausea or vomiting. Appetite excellent. Bowel movements functional. Starting to get some memory problems.  His son is with him throughout the encounter today.  Past history significant for degenerative disc disease, spinal stenosis, hypertension, coronary coronary artery disease. He ambulates with a walker. Does not report any falls but is a little bit unstable. Has had ventricular bigeminy in the past has macular degeneration last colonoscopy 2009. Cholecystectomy 2015.. I found a large grapefruit sized inguinal hernia on the right side. No hernia on the left. With him supine and was able to reduce this  after about 30 or 45 seconds. I agree with James Calhoun that it would be in his best interest to undergo elective surgery because I think he is heading for an incarceration or possible strangulation at some point. I think the risk of surgery is less than the risk of observation over time in this gentleman He agrees. He'll be scheduled for open repair of right inguinal hernia with mesh. I discussed the indications, details, techniques, numerous risk of the surgery with him and his son. He agrees with this plan. He will be observed in the hospital overnight.  He is at increased risk for memory problems, voiding difficulty, bleeding.  If his mental status deteriorates he may need SNF placement.  Hospital Course: On the day of admission the patient was taken to the operating room and underwent open repair of right inguinal hernia with mesh.  The surgery was uneventful and went well.  Because of advanced age, febrile state and comorbidities he was kept in the hospital overnight.  On postop day 1 he was stable.  He was voiding with a condom catheter.  Later in the day bladder scan showed his much is 500 to 600 cc of urine despite voiding.  A Foley catheter was placed.  In the short-term I feel this is a good idea to protect renal function although his urinary retention may be chronic.  Creatinine postop was 1.1 which was stable.  I advised discharged to SNF with a catheter and follow-up with [facility] in a couple of weeks.      He was seen by physical therapy, Occupational Therapy, social work, and case management.  Nursing home placement was favored by the patient and his son, as well as all therapist.  FL 2 form was signed.  Placement is pending but possibly may be discharged today.     [name], the patient was doing well.  He felt better with the Foley catheter in place.  Right groin incision had mild edema but no signs of infection or hematoma.  Abdomen was soft and nontender  and nondistended.  Lungs were clear.  Although hard of hearing he was alert and comfortable and in no distress.. Vital signs the morning of postop day 2 revealed heart rate 70, temp 97.7, respiratory rate 18, BP 155/78, SPO2 95%.      I advised continuation of all of his usual medications, including aspirin      Lovenox will be discontinued      Follow-up with me in 3 weeks is recommended      Follow-up with his primary physician, James Calhoun in the next couple of weeks is recommended       Follow-up with [facility] for management of Foley and urinary retention in the next couple of weeks is recommended              At the time of this dictation, the patient meets discharge criteria from a medical and surgical standpoint and he may go to SNF once bed is available     It appears [facility] [place] [facility] been selected for SNF and rehab placement        Consults: PT, OT, SW,  CM  Significant Diagnostic Studies: Basic laboratory work  Treatments: surgery: Open repair right inguinal hernia with mesh, insertion Foley catheter  Disposition: Skilled nursing facility  Patient Instructions:  Allergies as of 11/18/2018   No Known Allergies     Medication List    TAKE these medications   aspirin 81 MG tablet Take 81 mg by mouth daily.   furosemide 20 MG tablet Commonly known as:  LASIX Take 20 mg by mouth daily.   gabapentin 300 MG capsule Commonly known as:  NEURONTIN Take 300 mg by mouth at bedtime.   losartan 50 MG tablet Commonly known as:  COZAAR Take 50 mg by mouth daily.   metoprolol succinate 25 MG 24 hr tablet Commonly known as:  TOPROL-XL Take 25 mg by mouth daily.   traMADol 50 MG tablet Commonly known as:  ULTRAM Take 50 mg by mouth 3 (three) times daily as needed for moderate pain.   verapamil 240 MG CR tablet Commonly known as:  CALAN-SR Take 240 mg by mouth at bedtime.            Durable Medical Equipment   (From admission, onward)         Start     Ordered   11/18/18 0621  DME  3-in-1  Once     11/18/18 8295          Activity: Out of bed.  Ambulate with walker with assistance.  May shower Diet: low fat, low cholesterol diet Wound Care: as directed  Follow-up:  With Dr. Derrell Lolling in 3 weeks.  Signed: Angelia Mould. Derrell Lolling, M.D., FACS General and minimally invasive surgery Breast and Colorectal Surgery  11/18/2018, 6:25 AM

## 2018-11-18 NOTE — Progress Notes (Signed)
Physical Therapy Treatment Patient Details Name: James Calhoun MRN: [id number] DOB: [birth year] Today's Date: 11/18/2018    History of Present Illness 85 y.o. male admitted for R inguinal hernia repair. PMH of degenerative disc disease, spinal stenosis, hypertension, coronary coronary artery disease,  ventricular bigeminy , macular degeneration, colonoscopy 2009.  Cholecystectomy 2015.    PT Comments    Patient making good progress towards goals. Tolerable to progressive gait distances this session, however does continue to report pain at incision site limiting mobility. Mild posterior LOB upon initial standing today with patient requiring increased time and use of posterior LE on bed to regain balance. Will continue to follow.    Follow Up Recommendations  Supervision/Assistance - 24 hour;Supervision for mobility/OOB;SNF     Equipment Recommendations  None recommended by PT    Recommendations for Other Services       Precautions / Restrictions Precautions Precautions: Fall Precaution Comments: son reports pt has fallen to floor and not been able to get up 2x in past 3 months Restrictions Weight Bearing Restrictions: No    Mobility  Bed Mobility Overal bed mobility: Needs Assistance Bed Mobility: Supine to Sit;Sit to Supine     Supine to sit: Supervision Sit to supine: Supervision   General bed mobility comments: increased time and effort with use of bed rails; verbal cueing for sequencing; PT managing lines and tubes  Transfers Overall transfer level: Needs assistance Equipment used: Rolling walker (2 wheeled) Transfers: Sit to/from Stand Sit to Stand: Min guard         General transfer comment: sit to/from stand at bedside; increased effort to stand at bedside with pushing up from bed; mild posterior LOB upon initial standing  Ambulation/Gait Ambulation/Gait assistance: Min guard Gait Distance (Feet): (1 lap around unit with 2-3 standing rest breaks) Assistive  device: Rolling walker (2 wheeled) Gait Pattern/deviations: Shuffle;Trunk flexed Gait velocity: WFL   General Gait Details: shuffles at baseline per son; cueing for safety with device with Min A for RW management and obstacle navigation   Stairs             Wheelchair Mobility    Modified Rankin (Stroke Patients Only)       Balance Overall balance assessment: Needs assistance;History of Falls Sitting-balance support: Feet supported Sitting balance-Leahy Scale: Good     Standing balance support: Bilateral upper extremity supported;During functional activity;No upper extremity supported Standing balance-Leahy Scale: Fair Standing balance comment: close minguard for static standing; overall reliant on UE support                            Cognition Arousal/Alertness: Awake/alert Behavior During Therapy: WFL for tasks assessed/performed Overall Cognitive Status: History of cognitive impairments - at baseline                                 General Comments: per previous chart review pt's son reporting decline in STM over past 3 months; difficult to fully assess due to pt very HOH      Exercises      General Comments General comments (skin integrity, edema, etc.): son present and supportive      Pertinent Vitals/Pain Pain Assessment: Faces Faces Pain Scale: Hurts little more Pain Location: inguinal area surgical site Pain Descriptors / Indicators: Sore Pain Intervention(s): Limited activity within patient's tolerance;Monitored during session;Repositioned    Home Living  Prior Function            PT Goals (current goals can now be found in the care plan section) Acute Rehab PT Goals Patient Stated Goal: to return home PT Goal Formulation: With family Time For Goal Achievement: 11/23/18 Potential to Achieve Goals: Fair Progress towards PT goals: Progressing toward goals    Frequency    Min  2X/week      PT Plan Current plan remains appropriate    Co-evaluation              AM-PAC PT "6 Clicks" Mobility   Outcome Measure  Help needed turning from your back to your side while in a flat bed without using bedrails?: A Little Help needed moving from lying on your back to sitting on the side of a flat bed without using bedrails?: A Little Help needed moving to and from a bed to a chair (including a wheelchair)?: A Little Help needed standing up from a chair using your arms (e.g., wheelchair or bedside chair)?: A Little Help needed to walk in hospital room?: A Little Help needed climbing 3-5 steps with a railing? : A Lot 6 Click Score: 17    End of Session Equipment Utilized During Treatment: Gait belt Activity Tolerance: Patient tolerated treatment well Patient left: with call bell/phone within reach;with family/visitor present;in bed Nurse Communication: Mobility status PT Visit Diagnosis: History of falling (Z91.81);Difficulty in walking, not elsewhere classified (R26.2);Pain     Time: 1610-9604 PT Time Calculation (min) (ACUTE ONLY): 28 min  Charges:  $Gait Training: 23-37 mins                      Kipp Laurence, PT, DPT Supplemental Physical Therapist 11/18/18 10:16 AM Pager: [phone] Office: [phone]

## 2018-11-18 NOTE — Progress Notes (Signed)
Called report to [place] at [place].

## 2018-12-18 DEATH — deceased
